# Patient Record
Sex: Female | Born: 1935 | Race: White | Hispanic: No | Smoking: Never smoker
Health system: Southern US, Community
[De-identification: ages and names within clinical notes are randomized; demographics above are authoritative.]

## PROBLEM LIST (undated history)

## (undated) DIAGNOSIS — K219 Gastro-esophageal reflux disease without esophagitis: Secondary | ICD-10-CM

## (undated) DIAGNOSIS — C50919 Malignant neoplasm of unspecified site of unspecified female breast: Secondary | ICD-10-CM

## (undated) DIAGNOSIS — N3010 Interstitial cystitis (chronic) without hematuria: Secondary | ICD-10-CM

## (undated) DIAGNOSIS — Z9882 Breast implant status: Secondary | ICD-10-CM

## (undated) DIAGNOSIS — M542 Cervicalgia: Secondary | ICD-10-CM

## (undated) DIAGNOSIS — Z87442 Personal history of urinary calculi: Secondary | ICD-10-CM

## (undated) DIAGNOSIS — N3020 Other chronic cystitis without hematuria: Secondary | ICD-10-CM

## (undated) DIAGNOSIS — I10 Essential (primary) hypertension: Secondary | ICD-10-CM

## (undated) DIAGNOSIS — R1310 Dysphagia, unspecified: Secondary | ICD-10-CM

## (undated) DIAGNOSIS — G8929 Other chronic pain: Secondary | ICD-10-CM

## (undated) DIAGNOSIS — A809 Acute poliomyelitis, unspecified: Secondary | ICD-10-CM

## (undated) DIAGNOSIS — E039 Hypothyroidism, unspecified: Secondary | ICD-10-CM

## (undated) DIAGNOSIS — K589 Irritable bowel syndrome without diarrhea: Secondary | ICD-10-CM

## (undated) DIAGNOSIS — F0390 Unspecified dementia without behavioral disturbance: Secondary | ICD-10-CM

## (undated) DIAGNOSIS — R3129 Other microscopic hematuria: Secondary | ICD-10-CM

## (undated) DIAGNOSIS — M549 Dorsalgia, unspecified: Secondary | ICD-10-CM

## (undated) DIAGNOSIS — R32 Unspecified urinary incontinence: Secondary | ICD-10-CM

## (undated) HISTORY — DX: Breast implant status: Z98.82

## (undated) HISTORY — DX: Irritable bowel syndrome without diarrhea: K58.9

## (undated) HISTORY — PX: KNEE SURGERY: SHX244

## (undated) HISTORY — DX: Dorsalgia, unspecified: M54.9

## (undated) HISTORY — PX: BREAST ENHANCEMENT SURGERY: SHX7

## (undated) HISTORY — DX: Cervicalgia: M54.2

## (undated) HISTORY — DX: Other microscopic hematuria: R31.29

## (undated) HISTORY — DX: Other chronic pain: G89.29

## (undated) HISTORY — DX: Personal history of urinary calculi: Z87.442

## (undated) HISTORY — DX: Unspecified urinary incontinence: R32

## (undated) HISTORY — DX: Essential (primary) hypertension: I10

## (undated) HISTORY — DX: Gastro-esophageal reflux disease without esophagitis: K21.9

## (undated) HISTORY — DX: Hypothyroidism, unspecified: E03.9

## (undated) HISTORY — DX: Other chronic cystitis without hematuria: N30.20

## (undated) HISTORY — DX: Acute poliomyelitis, unspecified: A80.9

## (undated) HISTORY — DX: Malignant neoplasm of unspecified site of unspecified female breast: C50.919

## (undated) HISTORY — DX: Interstitial cystitis (chronic) without hematuria: N30.10

## (undated) HISTORY — DX: Dysphagia, unspecified: R13.10

---

## 1978-02-12 HISTORY — PX: ABDOMINAL HYSTERECTOMY: SHX81

## 1991-07-01 HISTORY — PX: OTHER SURGICAL HISTORY: SHX169

## 1994-02-12 HISTORY — PX: CHOLECYSTECTOMY: SHX55

## 2003-08-18 ENCOUNTER — Other Ambulatory Visit: Payer: Self-pay

## 2005-11-14 ENCOUNTER — Ambulatory Visit: Payer: Self-pay | Admitting: Gastroenterology

## 2006-02-19 ENCOUNTER — Ambulatory Visit: Payer: Self-pay | Admitting: Unknown Physician Specialty

## 2006-04-02 ENCOUNTER — Other Ambulatory Visit: Payer: Self-pay

## 2006-04-09 ENCOUNTER — Inpatient Hospital Stay: Payer: Self-pay | Admitting: Unknown Physician Specialty

## 2006-07-21 ENCOUNTER — Ambulatory Visit: Payer: Self-pay | Admitting: Emergency Medicine

## 2007-05-21 ENCOUNTER — Ambulatory Visit: Payer: Self-pay | Admitting: Unknown Physician Specialty

## 2007-12-26 ENCOUNTER — Emergency Department: Payer: Self-pay | Admitting: Emergency Medicine

## 2007-12-27 ENCOUNTER — Inpatient Hospital Stay (HOSPITAL_COMMUNITY): Admission: EM | Admit: 2007-12-27 | Discharge: 2007-12-30 | Payer: Self-pay | Admitting: General Surgery

## 2008-06-17 ENCOUNTER — Ambulatory Visit: Payer: Self-pay | Admitting: Family Medicine

## 2009-06-14 ENCOUNTER — Ambulatory Visit: Payer: Self-pay | Admitting: Family Medicine

## 2009-06-24 ENCOUNTER — Ambulatory Visit: Payer: Self-pay | Admitting: Family Medicine

## 2009-06-27 ENCOUNTER — Ambulatory Visit: Payer: Self-pay | Admitting: Family Medicine

## 2009-09-25 HISTORY — PX: CYSTOSCOPY: SUR368

## 2010-05-18 ENCOUNTER — Ambulatory Visit: Payer: Self-pay | Admitting: Gastroenterology

## 2010-05-22 ENCOUNTER — Encounter: Payer: Self-pay | Admitting: Gastroenterology

## 2010-05-22 LAB — PATHOLOGY REPORT

## 2010-06-13 ENCOUNTER — Encounter: Payer: Self-pay | Admitting: Gastroenterology
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
## 2010-08-02 ENCOUNTER — Ambulatory Visit: Payer: Self-pay | Admitting: Family Medicine

## 2010-11-15 LAB — DIFFERENTIAL
Basophils Absolute: 0
Eosinophils Absolute: 0
Eosinophils Relative: 0
Lymphs Abs: 1.3
Monocytes Absolute: 0.7
Neutrophils Relative %: 73

## 2010-11-15 LAB — BASIC METABOLIC PANEL
BUN: 12
Creatinine, Ser: 0.71
GFR calc Af Amer: >60
Potassium: 4.3
Sodium: 140

## 2010-11-15 LAB — CBC
HCT: 36.4
Hemoglobin: 12.4
MCHC: 34
MCV: 94.4

## 2011-04-18 DIAGNOSIS — G14 Postpolio syndrome: Secondary | ICD-10-CM | POA: Insufficient documentation

## 2011-04-18 DIAGNOSIS — G4700 Insomnia, unspecified: Secondary | ICD-10-CM | POA: Insufficient documentation

## 2011-11-22 ENCOUNTER — Ambulatory Visit: Payer: Self-pay | Admitting: Family Medicine

## 2011-11-22 LAB — CREATININE, SERUM
EGFR (African American): >60
EGFR (Non-African Amer.): >60

## 2011-12-12 ENCOUNTER — Ambulatory Visit: Payer: Self-pay | Admitting: Family Medicine

## 2012-01-15 ENCOUNTER — Ambulatory Visit: Payer: Self-pay | Admitting: Gastroenterology

## 2012-01-25 ENCOUNTER — Other Ambulatory Visit: Payer: Self-pay | Admitting: Gastroenterology

## 2012-01-25 ENCOUNTER — Ambulatory Visit: Payer: Self-pay | Admitting: Gastroenterology

## 2012-01-28 LAB — PATHOLOGY REPORT

## 2012-07-21 DIAGNOSIS — E7800 Pure hypercholesterolemia, unspecified: Secondary | ICD-10-CM | POA: Insufficient documentation

## 2012-08-12 DIAGNOSIS — R609 Edema, unspecified: Secondary | ICD-10-CM | POA: Insufficient documentation

## 2012-12-16 ENCOUNTER — Ambulatory Visit: Payer: Self-pay | Admitting: Family Medicine

## 2013-06-18 DIAGNOSIS — Z8673 Personal history of transient ischemic attack (TIA), and cerebral infarction without residual deficits: Secondary | ICD-10-CM | POA: Insufficient documentation

## 2013-06-18 DIAGNOSIS — I6529 Occlusion and stenosis of unspecified carotid artery: Secondary | ICD-10-CM | POA: Insufficient documentation

## 2013-07-21 ENCOUNTER — Emergency Department: Payer: Self-pay | Admitting: Emergency Medicine

## 2013-07-21 DIAGNOSIS — K219 Gastro-esophageal reflux disease without esophagitis: Secondary | ICD-10-CM | POA: Insufficient documentation

## 2013-07-21 LAB — CBC WITH DIFFERENTIAL/PLATELET
Basophil #: 0.1 10*3/uL (ref 0.0–0.1)
Basophil %: 0.9 %
Eosinophil #: 0.2 10*3/uL (ref 0.0–0.7)
Eosinophil %: 2.5 %
HCT: 41 % (ref 35.0–47.0)
HGB: 13.5 g/dL (ref 12.0–16.0)
LYMPHS PCT: 24.7 %
Lymphocyte #: 1.7 10*3/uL (ref 1.0–3.6)
MCH: 31.1 pg (ref 26.0–34.0)
MCHC: 33 g/dL (ref 32.0–36.0)
MCV: 94 fL (ref 80–100)
MONOS PCT: 9.1 %
Monocyte #: 0.6 x10 3/mm (ref 0.2–0.9)
Neutrophil #: 4.4 10*3/uL (ref 1.4–6.5)
Neutrophil %: 62.8 %
PLATELETS: 220 10*3/uL (ref 150–440)
RBC: 4.35 10*6/uL (ref 3.80–5.20)
RDW: 12.6 % (ref 11.5–14.5)
WBC: 7 10*3/uL (ref 3.6–11.0)

## 2013-07-21 LAB — URINALYSIS, COMPLETE
BLOOD: NEGATIVE
Bacteria: NONE SEEN
Bilirubin,UR: NEGATIVE
GLUCOSE, UR: NEGATIVE mg/dL (ref 0–75)
Ketone: NEGATIVE
LEUKOCYTE ESTERASE: NEGATIVE
Nitrite: NEGATIVE
Ph: 7 (ref 4.5–8.0)
Protein: NEGATIVE
RBC,UR: 1 /HPF (ref 0–5)
SPECIFIC GRAVITY: 1.011 (ref 1.003–1.030)
Squamous Epithelial: <1

## 2013-07-21 LAB — COMPREHENSIVE METABOLIC PANEL
ALBUMIN: 3.7 g/dL (ref 3.4–5.0)
ALT: 12 U/L (ref 12–78)
ANION GAP: 6 — AB (ref 7–16)
Alkaline Phosphatase: 95 U/L
BUN: 14 mg/dL (ref 7–18)
Bilirubin,Total: 0.7 mg/dL (ref 0.2–1.0)
CALCIUM: 9 mg/dL (ref 8.5–10.1)
CHLORIDE: 103 mmol/L (ref 98–107)
CO2: 27 mmol/L (ref 21–32)
CREATININE: 1.09 mg/dL (ref 0.60–1.30)
EGFR (African American): 56 — ABNORMAL LOW
EGFR (Non-African Amer.): 48 — ABNORMAL LOW
GLUCOSE: 94 mg/dL (ref 65–99)
OSMOLALITY: 272 (ref 275–301)
Potassium: 3.5 mmol/L (ref 3.5–5.1)
SGOT(AST): 23 U/L (ref 15–37)
Sodium: 136 mmol/L (ref 136–145)
TOTAL PROTEIN: 7.1 g/dL (ref 6.4–8.2)

## 2013-07-21 LAB — LIPASE, BLOOD: LIPASE: 131 U/L (ref 73–393)

## 2013-07-21 LAB — TROPONIN I: Troponin-I: <0.02 ng/mL

## 2013-07-31 DIAGNOSIS — I480 Paroxysmal atrial fibrillation: Secondary | ICD-10-CM | POA: Insufficient documentation

## 2013-07-31 DIAGNOSIS — I4891 Unspecified atrial fibrillation: Secondary | ICD-10-CM | POA: Insufficient documentation

## 2014-03-23 ENCOUNTER — Ambulatory Visit: Payer: Self-pay | Admitting: Urology

## 2014-05-05 ENCOUNTER — Ambulatory Visit: Payer: Self-pay | Admitting: Gastroenterology

## 2014-05-06 LAB — CREATININE, SERUM
Creatinine: 0.68 mg/dL
EGFR (African American): >60
EGFR (Non-African Amer.): >60

## 2014-07-23 DIAGNOSIS — Z789 Other specified health status: Secondary | ICD-10-CM | POA: Insufficient documentation

## 2014-07-24 DIAGNOSIS — R739 Hyperglycemia, unspecified: Secondary | ICD-10-CM | POA: Insufficient documentation

## 2014-08-09 ENCOUNTER — Encounter: Payer: Self-pay | Admitting: *Deleted

## 2014-08-17 ENCOUNTER — Ambulatory Visit
Admission: RE | Admit: 2014-08-17 | Discharge: 2014-08-17 | Disposition: A | Discharge status: Home / Self Care | Payer: Medicare HMO | Source: Ambulatory Visit | Attending: Urology | Admitting: Urology

## 2014-08-17 ENCOUNTER — Ambulatory Visit (INDEPENDENT_AMBULATORY_CARE_PROVIDER_SITE_OTHER): Payer: Self-pay | Admitting: Urology

## 2014-08-17 ENCOUNTER — Encounter: Payer: Self-pay | Admitting: Urology

## 2014-08-17 ENCOUNTER — Telehealth: Payer: Self-pay

## 2014-08-17 VITALS — BP 172/84 | HR 71 | Resp 18 | Ht 62.0 in

## 2014-08-17 DIAGNOSIS — E039 Hypothyroidism, unspecified: Secondary | ICD-10-CM | POA: Insufficient documentation

## 2014-08-17 DIAGNOSIS — I10 Essential (primary) hypertension: Secondary | ICD-10-CM | POA: Insufficient documentation

## 2014-08-17 DIAGNOSIS — R1030 Lower abdominal pain, unspecified: Secondary | ICD-10-CM | POA: Diagnosis present

## 2014-08-17 DIAGNOSIS — Z87442 Personal history of urinary calculi: Secondary | ICD-10-CM | POA: Insufficient documentation

## 2014-08-17 DIAGNOSIS — R102 Pelvic and perineal pain: Secondary | ICD-10-CM | POA: Insufficient documentation

## 2014-08-17 LAB — URINALYSIS, COMPLETE
BILIRUBIN UA: NEGATIVE
Glucose, UA: NEGATIVE
KETONES UA: NEGATIVE
NITRITE UA: NEGATIVE
PH UA: 6 (ref 5.0–7.5)
Protein, UA: NEGATIVE
RBC UA: NEGATIVE
Specific Gravity, UA: 1.025 (ref 1.005–1.030)
Urobilinogen, Ur: 0.2 mg/dL (ref 0.2–1.0)

## 2014-08-17 LAB — MICROSCOPIC EXAMINATION: Bacteria, UA: NONE SEEN

## 2014-08-17 NOTE — Progress Notes (Signed)
08/17/2014 11:45 AM   Loretta Johns 04-24-1931 [id number]  Referring provider: No referring provider defined for this encounter.  Chief Complaint  Patient presents with  . Flank Pain    Bilateral    HPI: [name] 81 year old white female with a history of kidney stones and interstitial cystitis who presents today complaining of suprapubic pain. This pain has been occurring for the last several days. Her previous stone presented in this manner and she is worried that she may have  another stone.  She denies any gross hematuria, flank pain, nausea, vomiting, fevers or chills.  Her previous kidney stone was treated with ESWL at [facility] back in December. We obtained a renal ultrasound in February which was negative for hydronephrosis. She had a CT scan of the abdomen and pelvis with contrast ordered by her gastroenterologist in March which did not identify any hydronephrosis or stones.  Her UA today is remarkable to 6-10 WBCs per power field. She is not experiencing any dysuria or vaginal burning. She does state that her perineal area becomes irritated when the urine hits the skin.   PMH: Past Medical History  Diagnosis Date  . Polio   . Breast cancer   . Hypothyroidism   . HBP (high blood pressure)   . Chronic cystitis   . IBS (irritable bowel syndrome)   . Microscopic hematuria   . History of augmentation mammoplasty   . Dysphagia   . Cervicodynia   . Esophageal reflux   . History of renal stone   . Incontinence   . Chronic back pain   . Interstitial cystitis     Surgical History: Past Surgical History  Procedure Laterality Date  . Cystoscopy  09/25/2009    with hydrodilation and bladder biopsy and fulgeration of trigone  . Cholecystectomy  1996  . Urethral polyps  07/01/1991  . Abdominal hysterectomy  1980  . Breast enhancement surgery    . Knee surgery Left     Home Medications:    Medication List       This list is accurate as of:  08/17/14 11:45 AM.  Always use your most recent med list.               dicyclomine 10 MG capsule  Commonly known as:  BENTYL  Take 10 mg by mouth 4 (four) times daily -  before meals and at bedtime.     diltiazem 120 MG 24 hr capsule  Commonly known as:  CARDIZEM CD  Take 1 capsule by mouth daily.     hydrochlorothiazide 12.5 MG capsule  Commonly known as:  MICROZIDE  Take 12.5 mg by mouth daily.     HYDROcodone-acetaminophen 10-325 MG per tablet  Commonly known as:  NORCO  Take 1 tablet by mouth every 6 (six) hours as needed.     HYOSCYAMINE SULFATE PO  Take by mouth.     MYRBETRIQ 25 MG Tb24 tablet  Generic drug:  mirabegron ER  Take 25 mg by mouth daily.     ranitidine 150 MG capsule  Commonly known as:  ZANTAC  Take 150 mg by mouth.     solifenacin 5 MG tablet  Commonly known as:  VESICARE  Take 5 mg by mouth daily.     SYNTHROID 137 MCG tablet  Generic drug:  levothyroxine  TAKE 137 MCG BY MOUTH ONCE DAILY.     triamcinolone cream 0.5 %  Commonly known as:  KENALOG  Apply topically.  Allergies:  Allergies  Allergen Reactions  . [place] Grass Allergy Skin Test   . Chocolate Flavor   . Midazolam   . Pravastatin Other (See Comments)    WAS UNABLE TO WALK PER PT.  Marland Kitchen Propoxyphene Napsylate [Propoxyphene]     Darvocet-N 100  . Reglan [Metoclopramide]     Family History: Family History  Problem Relation Age of Onset  . Kidney disease Brother     Reanl cancer  . Prostate cancer Neg Hx     Social History:  reports that she has never smoked. She does not have any smokeless tobacco history on file. She reports that she does not drink alcohol. Her drug history is not on file.  ROS: Urological Symptom Review  Patient is experiencing the following symptoms: Frequent urination Get up at night to urinate   Review of Systems  Gastrointestinal (upper)  : Negative for upper GI symptoms  Gastrointestinal (lower)  : Constipation  Constitutional : Negative for symptoms  Skin: Negative for skin symptoms  Eyes: Negative for eye symptoms  Ear/Nose/Throat : Negative for Ear/Nose/Throat symptoms  Hematologic/Lymphatic: Negative for Hematologic/Lymphatic symptoms  Cardiovascular : Negative for cardiovascular symptoms  Respiratory : Cough  Endocrine: Negative for endocrine symptoms  Musculoskeletal: Back pain Joint pain  Neurological: Negative for neurological symptoms  Psychologic: Negative for psychiatric symptoms   Physical Exam: BP 172/84 mmHg  Pulse 71  Resp 18  Ht _0  (1.575 m)   Laboratory Data: Results for orders placed or performed in visit on 05/05/14  Creatinine, serum  Result Value Ref Range   Creatinine 0.68 mg/dL   EGFR (African American) >60    EGFR (Non-African Amer.) >60    Lab Results  Component Value Date   WBC 7.0 07/21/2013   HGB 13.5 07/21/2013   HCT 41.0 07/21/2013   MCV 94 07/21/2013   PLT 220 07/21/2013    Lab Results  Component Value Date   CREATININE 0.68 05/06/2014    No results found for: PSA  No results found for: TESTOSTERONE  No results found for: HGBA1C  Urinalysis No results found for: COLORURINE, APPEARANCEUR, LABSPEC, PHURINE, GLUCOSEU, HGBUR, BILIRUBINUR, KETONESUR, PROTEINUR, UROBILINOGEN, NITRITE, LEUKOCYTESUR  Pertinent Imaging:   Assessment & Plan:    1. Suprapubic pain:   Patient with a several day history of suprapubic pain. UA is significant for 6-10 WBCs per high-power field. She is worried about another kidney stone since her last stone presented in this way back in December 2015. We will obtain a KUB today for further evaluation.  Urine will be sent for culture. She'll be contacted with those results.  2. History of stone:   Patient is having symptoms reminiscent of previous stone. We will obtain a KUB.  She'll be contacted with the results of KUB and given further instructions.     - Urinalysis,  Complete   No Follow-up on file.  Zara Council, [facility] [address], [place], [place] [address] [phone]

## 2014-08-17 NOTE — Telephone Encounter (Signed)
-----   Message from Nori Riis, PA-C sent at 08/17/2014  1:37 PM EDT ----- KUB does not demonstrate any stones.

## 2014-08-17 NOTE — Telephone Encounter (Signed)
Spoke with pt daughter and gave KUB results. Cw,lpn

## 2014-08-19 LAB — CULTURE, URINE COMPREHENSIVE

## 2014-08-20 ENCOUNTER — Telehealth: Payer: Self-pay

## 2014-08-20 NOTE — Telephone Encounter (Signed)
-----   Message from Nori Riis, PA-C sent at 08/19/2014  5:15 PM EDT ----- Urine culture is negative for infection.  Organism is most likely a skin contaminate.

## 2014-08-20 NOTE — Telephone Encounter (Signed)
Spoke with pt in reference to negative urine cx. Pt voiced understanding but c/o urinary urgency. Please advise. Cw,lpn

## 2014-08-20 NOTE — Telephone Encounter (Signed)
Is she still taking her Myrbetriq?  If so, she can increase it to 50 mg.  You can give her samples.

## 2014-08-23 NOTE — Telephone Encounter (Signed)
Spoke with pt who stated she is currently taking 25mg . Made her aware of needing to try 50mg . Pt voiced understanding. Samples left at the front. Cw,lpn

## 2014-10-05 ENCOUNTER — Telehealth: Payer: Self-pay

## 2014-10-05 NOTE — Telephone Encounter (Signed)
Myrbetriq is a beta-blocker for the bladder and sulfamethoxazole is an antibiotic.  It is no longer recommended to be on antibiotics long term by ID because of the increase of bacterial resistance.  What symptoms is she having?

## 2014-10-05 NOTE — Telephone Encounter (Signed)
Pt called stating myrbetriq is not working for her and she would like to go back on sulfamethoxazole. Per pt Dr. Ernst Spell perscripted sulfamethoxazole and it worked well. Please advise.

## 2014-10-14 ENCOUNTER — Ambulatory Visit: Payer: Medicare HMO

## 2014-10-14 DIAGNOSIS — N390 Urinary tract infection, site not specified: Secondary | ICD-10-CM

## 2014-10-14 LAB — URINALYSIS, COMPLETE
BILIRUBIN UA: NEGATIVE
Glucose, UA: NEGATIVE
KETONES UA: NEGATIVE
NITRITE UA: NEGATIVE
Protein, UA: NEGATIVE
RBC UA: NEGATIVE
Specific Gravity, UA: 1.015 (ref 1.005–1.030)
Urobilinogen, Ur: 0.2 mg/dL (ref 0.2–1.0)
pH, UA: 7 (ref 5.0–7.5)

## 2014-10-14 LAB — MICROSCOPIC EXAMINATION: Bacteria, UA: NONE SEEN

## 2014-10-14 NOTE — Progress Notes (Signed)
Pt came in today to give a clean catch urine. Pt called c/o dysuria and mal odorous urine. Urine was sent for u/a and cx.

## 2014-10-14 NOTE — Telephone Encounter (Signed)
Spoke with pt who stated she is having some burning and foul smelling odor. Pt wanted to give a sample therefore she will come in this afternoon.

## 2014-10-16 LAB — CULTURE, URINE COMPREHENSIVE

## 2014-10-20 ENCOUNTER — Telehealth: Payer: Self-pay | Admitting: *Deleted

## 2014-10-20 NOTE — Telephone Encounter (Signed)
Patient called back and left message on voicemail. Called patient back and spoke with her about her results. Culture is negative but patient insists something is going on still in pain in her kidneys and bladder. I let her know I would let Larene Beach know and get back to her. Patient asked if we were going to call her in anything and I explained to the patient we usually do not treat with antibiotic when culture comes back negative. Patient ok with plan. Please advise.

## 2014-10-20 NOTE — Telephone Encounter (Signed)
Patient will need a renal stone protocol CT.

## 2014-10-20 NOTE — Telephone Encounter (Signed)
-----   Message from Nori Riis, PA-C sent at 10/17/2014 11:47 PM EDT ----- Please notify the patient that her urine culture is negative.

## 2014-10-20 NOTE — Telephone Encounter (Signed)
LMOM for patient to return call about labs.

## 2014-10-22 NOTE — Telephone Encounter (Signed)
Called and spoke with patient about renal stone protocol CT. Patient states she had a CT done when they discovered her stones and had them blasted and that is when her problems started. Patient states you used to give her samples of myrbetriq and didn't know why you didn't want her to have it anymore. I stated I would ask but it may be as simple as her insurance would not cover it and we can't keep giving samples out indefinitely if insurance will never cover it. Patient does not seem to want a CT at this time. Please advise.

## 2014-10-24 NOTE — Telephone Encounter (Signed)
She can have Myrbetriq samples.  The reps stated that they would provide samples to those who's insurance would not cover the medication.

## 2014-10-25 NOTE — Telephone Encounter (Signed)
Called patient to let her know Myrbetriq samples are up front for pick up. LMOM.

## 2014-10-29 ENCOUNTER — Other Ambulatory Visit: Payer: Self-pay

## 2014-10-29 DIAGNOSIS — N200 Calculus of kidney: Secondary | ICD-10-CM

## 2014-10-29 NOTE — Progress Notes (Signed)
Pt called asking why she had not heard from imaging center in reference to CT scan. Nurse made pt aware she previously denied a CT. Pt stated "well I need it now! Im hurting and I cant get any relief." Nurse made pt aware that a CT would be the best route to go at this time per [facility]. Pt voiced understanding requesting a CT to be ordered. CT orders are placed.

## 2015-05-31 ENCOUNTER — Emergency Department
Admission: EM | Admit: 2015-05-31 | Discharge: 2015-05-31 | Disposition: A | Discharge status: Home / Self Care | Payer: Medicare PPO | Attending: Emergency Medicine | Admitting: Emergency Medicine

## 2015-05-31 ENCOUNTER — Emergency Department: Payer: Medicare PPO

## 2015-05-31 ENCOUNTER — Encounter: Payer: Self-pay | Admitting: Emergency Medicine

## 2015-05-31 DIAGNOSIS — R51 Headache: Secondary | ICD-10-CM | POA: Diagnosis not present

## 2015-05-31 DIAGNOSIS — Z853 Personal history of malignant neoplasm of breast: Secondary | ICD-10-CM | POA: Insufficient documentation

## 2015-05-31 DIAGNOSIS — E039 Hypothyroidism, unspecified: Secondary | ICD-10-CM | POA: Insufficient documentation

## 2015-05-31 DIAGNOSIS — I10 Essential (primary) hypertension: Secondary | ICD-10-CM | POA: Insufficient documentation

## 2015-05-31 DIAGNOSIS — R519 Headache, unspecified: Secondary | ICD-10-CM

## 2015-05-31 NOTE — Discharge Instructions (Signed)
General Headache Without Cause A headache is pain or discomfort felt around the head or neck area. The specific cause of a headache may not be found. There are many causes and types of headaches. A few common ones are:  Tension headaches.  Migraine headaches.  Cluster headaches.  Chronic daily headaches. HOME CARE INSTRUCTIONS  Watch your condition for any changes. Take these steps to help with your condition: Managing Pain  Take over-the-counter and prescription medicines only as told by your health care provider.  Lie down in a dark, quiet room when you have a headache.  If directed, apply ice to the head and neck area:  Put ice in a plastic bag.  Place a towel between your skin and the bag.  Leave the ice on for 20 minutes, 2-3 times per day.  Use a heating pad or hot shower to apply heat to the head and neck area as told by your health care provider.  Keep lights dim if bright lights bother you or make your headaches worse. Eating and Drinking  Eat meals on a regular schedule.  Limit alcohol use.  Decrease the amount of caffeine you drink, or stop drinking caffeine. General Instructions  Keep all follow-up visits as told by your health care provider. This is important.  Keep a headache journal to help find out what may trigger your headaches. For example, write down:  What you eat and drink.  How much sleep you get.  Any change to your diet or medicines.  Try massage or other relaxation techniques.  Limit stress.  Sit up straight, and do not tense your muscles.  Do not use tobacco products, including cigarettes, chewing tobacco, or e-cigarettes. If you need help quitting, ask your health care provider.  Exercise regularly as told by your health care provider.  Sleep on a regular schedule. Get 7-9 hours of sleep, or the amount recommended by your health care provider. SEEK MEDICAL CARE IF:   Your symptoms are not helped by medicine.  You have a  headache that is different from the usual headache.  You have nausea or you vomit.  You have a fever. SEEK IMMEDIATE MEDICAL CARE IF:   Your headache becomes severe.  You have repeated vomiting.  You have a stiff neck.  You have a loss of vision.  You have problems with speech.  You have pain in the eye or ear.  You have muscular weakness or loss of muscle control.  You lose your balance or have trouble walking.  You feel faint or pass out.  You have confusion.   This information is not intended to replace advice given to you by your health care provider. Make sure you discuss any questions you have with your health care provider.   Document Released: 01/29/2005 Document Revised: 10/20/2014 Document Reviewed: 05/24/2014 Elsevier Interactive Patient Education [employer].  Please return immediately if condition worsens. Please contact her primary physician or the physician you were given for referral. If you have any specialist physicians involved in her treatment and plan please also contact them. Thank you for using [place] [facility] emergency Department. Headache may be either muscle tension in nature possibly posttraumatic concussion return especially if he develops any weakness, blurred vision or loss of vision or any other new concerns.
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
## 2015-05-31 NOTE — ED Notes (Signed)
Pt informed to return if any life threatening symptoms occur.  

## 2015-05-31 NOTE — ED Notes (Signed)
Pt to ed with c/o right side of head pain and blurred vision. Pt states she fell about 2 weeks ago and initially did not have any pain but over the last few days started with pain on right side of head.  Denies use of blood thinners.

## 2015-06-30 ENCOUNTER — Encounter: Payer: Self-pay | Admitting: Emergency Medicine

## 2015-06-30 ENCOUNTER — Ambulatory Visit
Admit: 2015-06-30 | Discharge: 2015-06-30 | Disposition: A | Discharge status: Home / Self Care | Payer: Medicare PPO | Attending: Emergency Medicine | Admitting: Emergency Medicine

## 2015-06-30 ENCOUNTER — Ambulatory Visit
Admission: EM | Admit: 2015-06-30 | Discharge: 2015-06-30 | Disposition: A | Discharge status: Home / Self Care | Payer: Medicare PPO | Attending: Emergency Medicine | Admitting: Emergency Medicine

## 2015-06-30 DIAGNOSIS — I82811 Embolism and thrombosis of superficial veins of right lower extremities: Secondary | ICD-10-CM

## 2015-06-30 DIAGNOSIS — M79661 Pain in right lower leg: Secondary | ICD-10-CM | POA: Insufficient documentation

## 2015-06-30 DIAGNOSIS — I8001 Phlebitis and thrombophlebitis of superficial vessels of right lower extremity: Secondary | ICD-10-CM | POA: Diagnosis not present

## 2015-06-30 NOTE — ED Notes (Signed)
Pt will go to [facility] for [place] of right lower leg to rule out DVT.

## 2015-06-30 NOTE — ED Provider Notes (Signed)
CSN: [id number]     Arrival date & time 06/30/15  1420 History   First MD Initiated Contact with Patient 06/30/15 1524     Chief Complaint  Patient presents with  . Leg Pain   (Consider location/radiation/quality/duration/timing/severity/associated sxs/prior Treatment) HPI  This is a 82-year-old female who presents with right lower calf swelling or warmth and pain. She states that she awoke with this this morning. She is also noticed some tenderness and redness over the posterior thigh as well. She had polio as a child and has a chronically swollen right leg. This leg is unaffected. She has not taken any long trips but does have a tendency of sitting for prolonged periods.    Past Medical History  Diagnosis Date  . Polio   . Breast cancer ([place])   . Hypothyroidism   . HBP (high blood pressure)   . Chronic cystitis   . IBS (irritable bowel syndrome)   . Microscopic hematuria   . History of augmentation mammoplasty   . Dysphagia   . Cervicodynia   . Esophageal reflux   . History of renal stone   . Incontinence   . Chronic back pain   . Interstitial cystitis    Past Surgical History  Procedure Laterality Date  . Cystoscopy  09/25/2009    with hydrodilation and bladder biopsy and fulgeration of trigone  . Cholecystectomy  1996  . Urethral polyps  07/01/1991  . Abdominal hysterectomy  1980  . Breast enhancement surgery    . Knee surgery Left    Family History  Problem Relation Age of Onset  . Kidney disease Brother     Reanl cancer  . Prostate cancer Neg Hx   . CAD Mother   . Cancer Father    Social History  Substance Use Topics  . Smoking status: Never Smoker   . Smokeless tobacco: None  . Alcohol Use: No   OB History    No data available     Review of Systems  Constitutional: Positive for activity change. Negative for fever, chills and fatigue.  Musculoskeletal: Positive for myalgias and gait problem.  Skin: Positive for color change.  All other systems  reviewed and are negative.   Allergies  [place] grass allergy skin test; Chocolate flavor; Midazolam; Pravastatin; Propoxyphene napsylate; and Reglan  Home Medications   Prior to Admission medications   Medication Sig Start Date End Date Taking? Authorizing Provider  dicyclomine (BENTYL) 10 MG capsule Take 10 mg by mouth 4 (four) times daily -  before meals and at bedtime.    Historical Provider, MD  diltiazem (CARDIZEM CD) 120 MG 24 hr capsule Take 1 capsule by mouth daily. 07/23/14 07/23/15  Historical Provider, MD  hydrochlorothiazide (MICROZIDE) 12.5 MG capsule Take 12.5 mg by mouth daily.    Historical Provider, MD  HYDROcodone-acetaminophen (NORCO) 10-325 MG per tablet Take 1 tablet by mouth every 6 (six) hours as needed.    Historical Provider, MD  HYOSCYAMINE SULFATE PO Take by mouth.    Historical Provider, MD  mirabegron ER (MYRBETRIQ) 25 MG TB24 tablet Take 25 mg by mouth daily.    Historical Provider, MD  ranitidine (ZANTAC) 150 MG capsule Take 150 mg by mouth. 02/02/13   Historical Provider, MD  solifenacin (VESICARE) 5 MG tablet Take 5 mg by mouth daily.    Historical Provider, MD  SYNTHROID 137 MCG tablet TAKE 137 [place]. 07/24/14   Historical Provider, MD   Meds Ordered and Administered this  Visit  Medications - No data to display  BP 145/77 mmHg  Pulse 81  Temp(Src) 97.9 F (36.6 C) (Oral)  Resp 18  SpO2 96% No data found.   Physical Exam  Constitutional: She is oriented to person, place, and time. She appears well-developed and well-nourished. No distress.  HENT:  Head: Normocephalic and atraumatic.  Eyes: Conjunctivae are normal. Pupils are equal, round, and reactive to light.  Neck: Normal range of motion. Neck supple.  Musculoskeletal: Normal range of motion. She exhibits edema and tenderness.  Examination of the right leg shows the mid calf to be very tender warm and mildly swollen. This extends proximal for several inches. She is also tender  on the mid posterolateral medial eye where there is redness and tenderness as well. The right leg is chronically swollen so circumference differential is not reliable. She does have a negative Homans sign. She is using a scooter for mobility  Neurological: She is alert and oriented to person, place, and time.  Skin: Skin is warm and dry. She is not diaphoretic.  Psychiatric: She has a normal mood and affect. Her behavior is normal. Judgment and thought content normal.  Nursing note and vitals reviewed.   ED Course  Procedures (including critical care time)  Labs Review Labs Reviewed - No data to display  Imaging Review US Venous Img Lower Unilateral Right  06/30/2015  CLINICAL DATA:  Right calf pain with tenderness and erythema. EXAM: RIGHT LOWER EXTREMITY VENOUS DOPPLER ULTRASOUND TECHNIQUE: Gray-scale sonography with graded compression, as well as color Doppler and duplex ultrasound were performed to evaluate the lower extremity deep venous systems from the level of the common femoral vein and including the common femoral, femoral, profunda femoral, popliteal and calf veins including the posterior tibial, peroneal and gastrocnemius veins when visible. The superficial great saphenous vein was also interrogated. Spectral Doppler was utilized to evaluate flow at rest and with distal augmentation maneuvers in the common femoral, femoral and popliteal veins. COMPARISON:  None. FINDINGS: Contralateral Common Femoral Vein: Respiratory phasicity is normal and symmetric with the symptomatic side. No evidence of thrombus. Normal compressibility. Common Femoral Vein: No evidence of thrombus. Normal compressibility, respiratory phasicity and response to augmentation. Saphenofemoral Junction: No evidence of thrombus. Normal compressibility and flow on color Doppler imaging. Profunda Femoral Vein: No evidence of thrombus. Normal compressibility and flow on color Doppler imaging. Femoral Vein: No evidence of  thrombus. Normal compressibility, respiratory phasicity and response to augmentation. Popliteal Vein: No evidence of thrombus. Normal compressibility, respiratory phasicity and response to augmentation. Calf Veins: No evidence of thrombus. Normal compressibility and flow on color Doppler imaging. Superficial Great Saphenous Vein: No evidence of thrombus. Normal compressibility and flow on color Doppler imaging. Venous Reflux:  None. Other Findings: There is evidence of superficial thrombophlebitis involving the short saphenous vein. The proximal calf segment contains nonocclusive thrombus. The vein does demonstrate some flow in the proximal calf. The short saphenous vein in the mid to distal calf contains occlusive thrombus. No extension of thrombus is identified into the popliteal vein through the saphenopopliteal junction. IMPRESSION: Evidence of superficial thrombophlebitis involving the right short saphenous vein in the calf. No evidence of deep vein thrombosis. Electronically Signed   By: Aletta Edouard M.D.   On: 06/30/2015 17:30     Visual Acuity Review  Right Eye Distance:   Left Eye Distance:   Bilateral Distance:    Right Eye Near:   Left Eye Near:    Bilateral Near:  MDM   1. Acute superficial venous thrombosis of lower extremity, right    Plan: 1. Test/x-ray results and diagnosis reviewed with patient 2. rx as per orders; risks, benefits, potential side effects reviewed with patient 3. Recommend supportive treatment with Rest elevation and warm compresses. Also start her on a daily aspirin she prefers over nostril anti-inflammatory medications. I recommended that she follow-up with her primary care physician next week. I talked to the patient directly in ultrasound following her D her a venous Doppler ultrasound plane to her the pathology. He does not have a DVT. Her treatment will be superficial and with aspirin only. If she has any further problems or worsening she  should go to the emergency room or ulcer primary care physician. 4. F/u prn if symptoms worsen or don't improve     Lorin Picket, PA-C 06/30/15 1821

## 2015-06-30 NOTE — Discharge Instructions (Signed)
Venous Thromboembolism Venous thromboembolism (VTE) is a condition in which a blood clot (thrombus) develops in the body. A thrombus usually occurs in a deep vein in the leg or the pelvis, but it can also occur in the arm. Sometimes, pieces of a thrombus can break off from its original place of development and travel through the bloodstream to other parts of the body. When that happens, the thrombus is called an embolism. An embolism can block the blood flow in the blood vessels of other organs. There are two serious types of VTE:  Deep vein thrombosis (DVT). A DVT is a thrombus that usually occurs in a deep, larger vein of the lower leg or the pelvis, or in an upper extremity such as the arm.  Pulmonary embolism (PE). A PE occurs when an embolism has formed and traveled to the lungs. A PE can block or decrease the blood flow in one lung or both lungs. VTE is a serious health condition that can cause disability or death. It is very important to get help right away and to not ignore symptoms. CAUSES VTE is caused by the formation of a blood clot in your leg, pelvis, or arm. Usually, several things contribute to the formation of blood clots. A clot may develop when:  Your blood flow slows down.  Your vein becomes damaged in some way.  You have a condition that makes your blood clot more easily. RISK FACTORS A VTE is more likely to develop in:  People who are older, especially over 55 years of age.  People who are overweight (obese).  People who sit or lie still for a long time, such as during long-distance travel (over 4 hours), bed rest, hospitalization, or during recovery from certain medical conditions like a stroke.  People who do not engage in much physical activity (sedentary lifestyle).  People who have chronic breathing disorders.  People who have a personal or family history of blood clots or blood clotting disease.  People who have peripheral vascular disease (PVD), diabetes,  or some types of cancer.  People who have heart disease, especially if the person had a recent heart attack or has congestive heart failure.  People who have neurological diseases that affect the legs (leg paresis).  People who have had a traumatic injury, such as breaking a hip or leg.  People who have recently had major or lengthy surgery, especially on the hip, knee, or abdomen.  People who have had a central line placed inside a large vein.  People who take medicines that contain the hormone estrogen. These include birth control pills and hormone replacement therapy.  Pregnancy or during childbirth or the postpartum period.  Long plane flights (over 8 hours). SIGNS AND SYMPTOMS  Symptoms of VTE can depend on where the clot is located and whether the clot breaks off and travels to another organ. Sometimes, there may be no symptoms. Symptoms of a DVT can include:  Swelling of your leg or arm, especially if one side is much worse.  Warmth and redness of your leg or arm, especially if one side is much worse.  Pain in your arm or leg. If the clot is in your leg, symptoms may be more noticeable or worse when you stand or walk.  A feeling of pins and needles if the clot is in the arm. The symptoms of a PE usually start suddenly and include:  Shortness of breath while active or at rest.  Coughing or coughing up blood or  blood-tinged mucus.  Chest pain that is often worse with deep breaths.  Rapid or irregular heartbeat.  Feeling light-headed or dizzy.  Fainting.  Feeling anxious.  Sweating. There may also be pain and swelling in a leg if that is where the blood clot started. These symptoms may represent a serious problem that is an emergency. Do not wait to see if the symptoms will go away. Get medical help right away. Call your local emergency services (911 in the [place] Do not drive yourself to the hospital. DIAGNOSIS Your health care provider will take a medical history  and perform a physical exam. You may also have other tests, including:  Blood tests to assess the clotting properties of your blood.  Imaging tests, such as CT, ultrasound, MRI, X-ray, and other tests to see if you have clots anywhere in your body.  An electrocardiogram (ECG) to look for heart strain from blood clots in the lungs.  An echocardiogram. TREATMENT After a VTE is identified, it can be treated. The main goals of treatment are:  To stop a blood clot from growing larger.  To stop new blood clots from forming.  To stop a blood clot from traveling to the lungs (pulmonary embolism). The type of treatment that you receive depends on many factors, such as the cause of your VTE, your risk for bleeding or developing more clots, and other medical conditions that you have. Sometimes, a combination of treatments is necessary. Treatment options may be combined and include:  Monitoring the blood clot with ultrasound.  Taking medicines by mouth, such as newer blood thinners (anticoagulants), thrombolytics, or warfarin.  Taking anticoagulant medicine by injection or through an IV tube.  Wearingcompression stockings or using different types of devices.  Surgery (rare) to remove the blood clot or to place a filter in your abdomen to stop the blood clot from traveling to your lungs. Treatments for VTE are often divided into immediate treatment and long-term treatment (up to 3 months after VTE). You can work with your health care provider to choose the treatment program that is best for you. HOME CARE INSTRUCTIONS If you are taking a newer oral anticoagulant:  Take the medicine every single day at the same time each day.  Understand what foods and drugs interact with this medicine.  Understand that there are no regular blood tests required when using this medicine.  Understand the side effects of this medicine, including excessive bruising or bleeding. Ask your health care provider or  pharmacist about other possible side effects. If you are taking warfarin:  Understand how to take warfarin and know which foods can affect how warfarin works in Veterinary surgeon.  Understand that it is dangerous to take too much or too little warfarin. Too much warfarin increases the risk of bleeding. Too little warfarin continues to allow the risk for blood clots.  Follow your PT and INR blood testing schedule. The PT and INR results allow your health care provider to adjust your dose of warfarin. It is very important that you have your PT and INR tested as often as told by your health care provider.  Avoid major changes in your diet, or tell your health care provider before you change your diet. Arrange a visit with a registered dietitian to answer your questions. Many foods, especially foods that are high in vitamin K, can interfere with warfarin and affect the PT and INR results. Eat a consistent amount of foods that are high in vitamin K, such as:  Spinach, kale, broccoli, cabbage, collard greens, turnip greens, Brussels sprouts, peas, cauliflower, seaweed, and parsley.  Beef liver and pork liver.  Green tea.  Soybean oil.  Tell your health care provider about any and all medicines, vitamins, and supplements that you take, including aspirin and other over-the-counter anti-inflammatory medicines. Be especially cautious with aspirin and anti-inflammatory medicines. Do not take those before you ask your health care provider if it is safe to do so. This is important because many medicines can interfere with warfarin and affect the PT and INR results.  Do not start or stop taking any over-the-counter or prescription medicine unless your health care provider or pharmacist tells you to do so. If you take warfarin, you will also need to do these things:  Hold pressure over cuts for longer than usual.  Tell your dentist and other health care providers that you are taking warfarin before you have any  procedures in which bleeding may occur.  Avoid alcohol or drink very small amounts. Tell your health care provider if you change your alcohol intake.  Do not use tobacco products, including cigarettes, chewing tobacco, and e-cigarettes. If you need help quitting, ask your health care provider.  Avoid contact sports. General Instructions  Take over-the-counter and prescription medicines only as told by your health care provider. Anticoagulant medicines can have side effects, including easy bruising and difficulty stopping bleeding. If you are prescribed an anticoagulant, you will also need to do these things:  Hold pressure over cuts for longer than usual.  Tell your dentist and other health care providers that you are taking anticoagulants before you have any procedures in which bleeding may occur.  Avoid contact sports.  Wear a medical alert bracelet or carry a medical alert card that says you have had a PE.  Ask your health care provider how soon you can go back to your normal activities. Stay active to prevent new blood clots from forming.  Make sure to exercise while traveling or when you have been sitting or standing for a long period of time. It is very important to exercise. Exercise your legs by walking or by tightening and relaxing your leg muscles often. Take frequent walks.  Wear compression stockings as told by your health care provider to help prevent more blood clots from forming.  Do not use tobacco products, including cigarettes, chewing tobacco, and e-cigarettes. If you need help quitting, ask your health care provider.  Keep all follow-up appointments with your health care provider. This is important. PREVENTION Take these actions to decrease your risk of developing another VTE:  Exercise regularly. For at least 30 minutes every day, engage in:  Activity that involves moving your arms and legs.  Activity that encourages good blood flow through your body by  increasing your heart rate.  Exercise your arms and legs every hour during long-distance travel (over 4 hours). Drink plenty of water and avoid drinking alcohol while traveling.  Avoid sitting or lying in bed for long periods of time without moving your legs.  Maintain a weight that is appropriate for your height. Ask your health care provider what weight is healthy for you.  If you are a woman who is over 46 years of age, avoid unnecessary use of medicines that contain estrogen. These include birth control pills.  Do not smoke, especially if you take estrogen medicines. If you need help quitting, ask your health care provider. If you are hospitalized, prevention measures may include:  Early walking after surgery,  as soon as your health care provider says that it is safe.  Receiving anticoagulants to prevent blood clots.If you cannot take anticoagulants, other options may be available, such as wearing compression stockings or using different types of devices. SEEK IMMEDIATE MEDICAL CARE IF:  You have new or increased pain, swelling, or redness in an arm or leg.  You have numbness or tingling in an arm or leg.  You have shortness of breath while active or at rest.  You have chest pain.  You have a rapid or irregular heartbeat.  You feel light-headed or dizzy.  You cough up blood.  You notice blood in your vomit, bowel movement, or urine. These symptoms may represent a serious problem that is an emergency. Do not wait to see if the symptoms will go away. Get medical help right away. Call your local emergency services (911 in the [place] Do not drive yourself to the hospital.   This information is not intended to replace advice given to you by your health care provider. Make sure you discuss any questions you have with your health care provider.   Document Released: 11/26/2008 Document Revised: 10/20/2014 Document Reviewed: 05/26/2014 Elsevier Interactive Patient Education [employer]

## 2015-06-30 NOTE — ED Notes (Signed)
Pt presents with right lower leg swelling in calf area, noticed this am. Hurts worse when weight bearing.

## 2016-01-27 ENCOUNTER — Emergency Department: Payer: Medicare PPO

## 2016-01-27 ENCOUNTER — Encounter: Payer: Self-pay | Admitting: Emergency Medicine

## 2016-01-27 ENCOUNTER — Inpatient Hospital Stay
Admission: EM | Admit: 2016-01-27 | Discharge: 2016-01-28 | DRG: 816 | Disposition: A | Discharge status: Home / Self Care | Payer: Medicare PPO | Attending: Internal Medicine | Admitting: Internal Medicine

## 2016-01-27 DIAGNOSIS — M419 Scoliosis, unspecified: Secondary | ICD-10-CM | POA: Diagnosis present

## 2016-01-27 DIAGNOSIS — K219 Gastro-esophageal reflux disease without esophagitis: Secondary | ICD-10-CM | POA: Diagnosis present

## 2016-01-27 DIAGNOSIS — I10 Essential (primary) hypertension: Secondary | ICD-10-CM | POA: Diagnosis present

## 2016-01-27 DIAGNOSIS — R1012 Left upper quadrant pain: Secondary | ICD-10-CM

## 2016-01-27 DIAGNOSIS — Z91018 Allergy to other foods: Secondary | ICD-10-CM

## 2016-01-27 DIAGNOSIS — Z79899 Other long term (current) drug therapy: Secondary | ICD-10-CM

## 2016-01-27 DIAGNOSIS — I700 Atherosclerosis of aorta: Secondary | ICD-10-CM | POA: Diagnosis present

## 2016-01-27 DIAGNOSIS — I480 Paroxysmal atrial fibrillation: Secondary | ICD-10-CM | POA: Diagnosis present

## 2016-01-27 DIAGNOSIS — K589 Irritable bowel syndrome without diarrhea: Secondary | ICD-10-CM | POA: Diagnosis present

## 2016-01-27 DIAGNOSIS — D735 Infarction of spleen: Principal | ICD-10-CM | POA: Diagnosis present

## 2016-01-27 DIAGNOSIS — G8929 Other chronic pain: Secondary | ICD-10-CM | POA: Diagnosis present

## 2016-01-27 DIAGNOSIS — Z853 Personal history of malignant neoplasm of breast: Secondary | ICD-10-CM

## 2016-01-27 DIAGNOSIS — I4891 Unspecified atrial fibrillation: Secondary | ICD-10-CM

## 2016-01-27 DIAGNOSIS — Z888 Allergy status to other drugs, medicaments and biological substances status: Secondary | ICD-10-CM

## 2016-01-27 DIAGNOSIS — Z8249 Family history of ischemic heart disease and other diseases of the circulatory system: Secondary | ICD-10-CM

## 2016-01-27 DIAGNOSIS — E039 Hypothyroidism, unspecified: Secondary | ICD-10-CM | POA: Diagnosis present

## 2016-01-27 DIAGNOSIS — R110 Nausea: Secondary | ICD-10-CM | POA: Diagnosis present

## 2016-01-27 DIAGNOSIS — M549 Dorsalgia, unspecified: Secondary | ICD-10-CM | POA: Diagnosis present

## 2016-01-27 LAB — HEPATIC FUNCTION PANEL
ALBUMIN: 3.9 g/dL (ref 3.5–5.0)
ALT: 15 U/L (ref 14–54)
AST: 30 U/L (ref 15–41)
Alkaline Phosphatase: 75 U/L (ref 38–126)
BILIRUBIN TOTAL: 0.8 mg/dL (ref 0.3–1.2)
Bilirubin, Direct: 0.1 mg/dL (ref 0.1–0.5)
Indirect Bilirubin: 0.7 mg/dL (ref 0.3–0.9)
TOTAL PROTEIN: 7.1 g/dL (ref 6.5–8.1)

## 2016-01-27 LAB — BASIC METABOLIC PANEL
Anion gap: 7 (ref 5–15)
BUN: 14 mg/dL (ref 6–20)
CALCIUM: 9 mg/dL (ref 8.9–10.3)
CO2: 24 mmol/L (ref 22–32)
CREATININE: 0.77 mg/dL (ref 0.44–1.00)
Chloride: 104 mmol/L (ref 101–111)
Glucose, Bld: 113 mg/dL — ABNORMAL HIGH (ref 65–99)
Potassium: 3.5 mmol/L (ref 3.5–5.1)
SODIUM: 135 mmol/L (ref 135–145)

## 2016-01-27 LAB — CBC
HCT: 40.7 % (ref 35.0–47.0)
Hemoglobin: 14.1 g/dL (ref 12.0–16.0)
MCH: 32.1 pg (ref 26.0–34.0)
MCHC: 34.8 g/dL (ref 32.0–36.0)
MCV: 92.3 fL (ref 80.0–100.0)
PLATELETS: 216 10*3/uL (ref 150–440)
RBC: 4.41 MIL/uL (ref 3.80–5.20)
RDW: 12.7 % (ref 11.5–14.5)
WBC: 8.6 10*3/uL (ref 3.6–11.0)

## 2016-01-27 LAB — LIPASE, BLOOD: LIPASE: 24 U/L (ref 11–51)

## 2016-01-27 LAB — TROPONIN I

## 2016-01-27 MED ORDER — MORPHINE SULFATE (PF) 4 MG/ML IV SOLN
2.0000 mg | Freq: Once | INTRAVENOUS | Status: AC
  Administered 2016-01-27: 2 mg via INTRAVENOUS
  Filled 2016-01-27: qty 1

## 2016-01-27 MED ORDER — IOPAMIDOL (ISOVUE-300) INJECTION 61%
30.0000 mL | Freq: Once | INTRAVENOUS | Status: AC | PRN
  Administered 2016-01-27: 30 mL via ORAL

## 2016-01-27 MED ORDER — ONDANSETRON HCL 4 MG/2ML IJ SOLN
4.0000 mg | INTRAMUSCULAR | Status: AC
  Administered 2016-01-27: 4 mg via INTRAVENOUS
  Filled 2016-01-27: qty 2

## 2016-01-27 MED ORDER — IOPAMIDOL (ISOVUE-300) INJECTION 61%
100.0000 mL | Freq: Once | INTRAVENOUS | Status: AC | PRN
  Administered 2016-01-27: 100 mL via INTRAVENOUS

## 2016-01-27 NOTE — ED Notes (Signed)
report off to April rn

## 2016-01-27 NOTE — ED Provider Notes (Signed)
[facility] Emergency Department Provider Note  ____________________________________________   First MD Initiated Contact with Patient 01/27/16 2029     (approximate)  I have reviewed the triage vital signs and the nursing notes.   HISTORY  Chief Complaint Shortness of Breath    HPI Loretta Johns is a 82 y.o. female who is relatively healthy for her age, independent, and with no history of dementia who presents for evaluation of abdominal pain since last night.  She has had several episodes of diarrhea and gradually worsening left upper quadrant abdominal pain since last night.  It is worse with deep breaths, ambulation, and movement, but also hurts at rest.  She reports the pain is at least moderate.  She states that she has not had as much diarrhea today but the pain has gotten steadily worse.  He denies taking any recent antibiotics.  She has had nausea but no vomiting.  The pain is also worse when she takes deep breaths but she is not having any difficulty breathing.  She has pain in the other part of her abdomen as well but is most notable on the left side.  She denies dysuria, fever/chills, chest pain.  She went to [facility] urgent care and was sent here for further evaluation of her abdominal tenderness to palpation.   Past Medical History:  Diagnosis Date  . Breast cancer ([place])   . Cervicodynia   . Chronic back pain   . Chronic cystitis   . Dysphagia   . Esophageal reflux   . HBP (high blood pressure)   . History of augmentation mammoplasty   . History of renal stone   . Hypothyroidism   . IBS (irritable bowel syndrome)   . Incontinence   . Interstitial cystitis   . Microscopic hematuria   . Polio     Patient Active Problem List   Diagnosis Date Noted  . Splenic infarct 01/27/2016  . Acquired hypothyroidism 08/17/2014  . Essential (primary) hypertension 08/17/2014  . Suprapubic pain 08/17/2014  . History of renal stone 08/17/2014    . Blood glucose elevated 07/24/2014  . Drug intolerance 07/23/2014  . Atrial fibrillation with rapid ventricular response ([place]) 07/31/2013  . AF (paroxysmal atrial fibrillation) ([place]) 07/31/2013  . Acid reflux 07/21/2013  . Carotid artery narrowing 06/18/2013  . H/O transient cerebral ischemia 06/18/2013  . Accumulation of fluid in tissues 08/12/2012  . Hypercholesterolemia without hypertriglyceridemia 07/21/2012  . Cannot sleep 04/18/2011  . Post poliomyelitis syndrome 04/18/2011    Past Surgical History:  Procedure Laterality Date  . ABDOMINAL HYSTERECTOMY  1980  . BREAST ENHANCEMENT SURGERY    . CHOLECYSTECTOMY  1996  . CYSTOSCOPY  09/25/2009   with hydrodilation and bladder biopsy and fulgeration of trigone  . KNEE SURGERY Left   . Urethral polyps  07/01/1991    Prior to Admission medications   Medication Sig Start Date End Date Taking? Authorizing Provider  dicyclomine (BENTYL) 10 MG capsule Take 10 mg by mouth 4 (four) times daily -  before meals and at bedtime.    Historical Provider, MD  diltiazem (CARDIZEM CD) 120 MG 24 hr capsule Take 1 capsule by mouth daily. 07/23/14 07/23/15  Historical Provider, MD  diltiazem (CARDIZEM CD) 120 MG 24 hr capsule Take 120 mg by mouth daily. 01/09/16   Historical Provider, MD  donepezil (ARICEPT) 10 MG tablet Take 10 mg by mouth at bedtime. 12/30/15   Historical Provider, MD  hydrochlorothiazide (MICROZIDE) 12.5 MG capsule Take 12.5  mg by mouth daily.    Historical Provider, MD  HYDROcodone-acetaminophen (NORCO) 10-325 MG per tablet Take 1 tablet by mouth every 6 (six) hours as needed.    Historical Provider, MD  HYOSCYAMINE SULFATE PO Take by mouth.    Historical Provider, MD  mirabegron ER (MYRBETRIQ) 25 MG TB24 tablet Take 25 mg by mouth daily.    Historical Provider, MD  ranitidine (ZANTAC) 150 MG capsule Take 150 mg by mouth. 02/02/13   Historical Provider, MD  solifenacin (VESICARE) 5 MG tablet Take 5 mg by mouth daily.     Historical Provider, MD  SYNTHROID 137 MCG tablet TAKE 137 [place]. 07/24/14   Historical Provider, MD    Allergies [place] grass allergy skin test; Chocolate flavor; Midazolam; Pravastatin; Propoxyphene napsylate [propoxyphene]; and Reglan [metoclopramide]  Family History  Problem Relation Age of Onset  . CAD Mother   . Cancer Father   . Kidney disease Brother     Reanl cancer  . Prostate cancer Neg Hx     Social History Social History  Substance Use Topics  . Smoking status: Never Smoker  . Smokeless tobacco: Never Used  . Alcohol use No    Review of Systems Constitutional: No fever/chills Eyes: No visual changes. ENT: No sore throat. Cardiovascular: Denies chest pain. Respiratory: No shortness of breath but pain with deep inspiration Gastrointestinal: +abdominal pain primarily in the left upper quadrant. nausea, no vomiting.  +diarrhea.  No constipation. Genitourinary: Negative for dysuria. Musculoskeletal: Negative for back pain. Skin: Negative for rash. Neurological: Negative for headaches, focal weakness or numbness.  10-point ROS otherwise negative.  ____________________________________________   PHYSICAL EXAM:  VITAL SIGNS: ED Triage Vitals  Enc Vitals Group     BP 01/27/16 1723 (!) 118/48     Pulse Rate 01/27/16 1723 99     Resp 01/27/16 1723 20     Temp 01/27/16 1723 97.7 F (36.5 C)     Temp Source 01/27/16 1723 Oral     SpO2 01/27/16 1723 97 %     Weight 01/27/16 1723 160 lb (72.6 kg)     Height 01/27/16 1723 5\' 2"  (1.575 m)     Head Circumference --      Peak Flow --      Pain Score 01/27/16 1724 5     Pain Loc --      Pain Edu? --      Excl. in [place]? --     Constitutional: Alert and oriented. Well appearing and in no acute distress. She does wince when she moves around in the bed but is willing and able to joke around with me during the interview Eyes: Conjunctivae are normal. PERRL. EOMI. Head: Atraumatic. Nose: No  congestion/rhinnorhea. Mouth/Throat: Mucous membranes are moist.  Oropharynx non-erythematous. Neck: No stridor.  No meningeal signs.   Cardiovascular: Normal rate, regular rhythm. Good peripheral circulation. Grossly normal heart sounds. Respiratory: Normal respiratory effort.  No retractions. Lungs CTAB. Gastrointestinal: Soft with significant tednerness to palpation in primarily in the LUQ w/ guarding.  No lower abd tenderness.  Mild RUQ tenderness. Musculoskeletal: No lower extremity tenderness nor edema. No gross deformities of extremities. Neurologic:  Normal speech and language. No gross focal neurologic deficits are appreciated.  Skin:  Skin is warm, dry and intact. No rash noted. Psychiatric: Mood and affect are normal. Speech and behavior are normal.  ____________________________________________   LABS (all labs ordered are listed, but only abnormal results are displayed)  Labs Reviewed  BASIC METABOLIC PANEL - Abnormal; Notable for the following:       Result Value   Glucose, Bld 113 (*)    All other components within normal limits  CBC  TROPONIN I  HEPATIC FUNCTION PANEL  LIPASE, BLOOD   ____________________________________________  EKG  ED ECG REPORT I, Sylar Voong, the attending physician, personally viewed and interpreted this ECG.  Date: 01/27/2016 EKG Time: 17:41 Rate: about 100 Rhythm: atrial fibrillation with significant artifact on EKG QRS Axis: normal Intervals: normal ST/T Wave abnormalities: Non-specific ST segment / T-wave changes, but no evidence of acute ischemia. Conduction Disturbances: none  ____________________________________________  RADIOLOGY   Dg Chest 2 View  Result Date: 01/27/2016 CLINICAL DATA:  Nausea since last night, diarrhea today, shortness of breath, LEFT anterior chest pain EXAM: CHEST  2 VIEW COMPARISON:  12/28/2007 FINDINGS: Minimal enlargement of cardiac silhouette. Mediastinal contours and pulmonary vascularity  normal. Mild subsegmental atelectasis at LEFT lung base. Lungs clear. No pleural effusion or pneumothorax. Eventration of RIGHT diaphragm appears stable. Bones demineralized. IMPRESSION: Subsegmental atelectasis at LEFT lung base. Minimal enlargement of cardiac silhouette. Electronically Signed   By: Lavonia Dana M.D.   On: 01/27/2016 18:08   Ct Chest W Contrast  Result Date: 01/27/2016 CLINICAL DATA:  82 year old female with left lower chest left upper quadrant pain. EXAM: CT CHEST, ABDOMEN, AND PELVIS WITH CONTRAST TECHNIQUE: Multidetector CT imaging of the chest, abdomen and pelvis was performed following the standard protocol during bolus administration of intravenous contrast. CONTRAST:  163mL ISOVUE-300 IOPAMIDOL (ISOVUE-300) INJECTION 61% COMPARISON:  Abdominal radiograph dated 08/17/2014 and CT dated 05/05/2014 FINDINGS: CT CHEST FINDINGS Cardiovascular: There is no cardiomegaly or pericardial effusion. There is mild atherosclerotic calcification of the thoracic aorta. No aneurysmal dilatation or evidence of dissection. There is atherosclerotic calcification with partial luminal occlusion of the visualized proximal left vertebral artery, likely chronic. Correlation with clinical exam recommended. CT angiography may provide better evaluation if clinically indicated. The remainder of the visualized portion of the origins of the great vessels of the aortic arch appear patent. The central pulmonary artery is are patent as well. Mediastinum/Nodes: There is no hilar or mediastinal adenopathy. The esophagus is grossly unremarkable. Lungs/Pleura: Left lung base linear atelectasis/ scarring. The lungs are otherwise clear. There is no pleural effusion or pneumothorax. The central airways are patent. Musculoskeletal: There is no axillary adenopathy. The chest wall soft tissues appear unremarkable. There is scoliosis with osteopenia and degenerative changes of the spine. No acute fracture. CT ABDOMEN PELVIS FINDINGS  No intra-abdominal free air or free fluid. Hepatobiliary: Cholecystectomy. There is mild dilatation of the common bile duct, likely post cholecystectomy. The liver is unremarkable. Subcentimeter left hepatic hypodense lesion is too small to characterize but likely represents a cyst or hemangioma. Pancreas: Unremarkable. No pancreatic ductal dilatation or surrounding inflammatory changes. Spleen: There is a 1.7 x 3.0 cm wedge-shaped area of subcapsular hypodensity in the posterior aspect of the inferior spleen most compatible with a focal splenic infarct. There is no hematoma, drainable fluid collection, or abscess. Adrenals/Urinary Tract: An 8 mm left adrenal nodule, indeterminate, likely an adenoma. The right adrenal gland appears unremarkable. Multiple left renal parapelvic cysts noted. There is no hydronephrosis on either side. There is mild bilateral renal atrophy. The visualized ureters and urinary bladder appear unremarkable. Stomach/Bowel: There is extensive sigmoid diverticulosis with muscular hypertrophy. No active inflammatory changes. There is no evidence of bowel obstruction or active inflammation. Normal appendix. Vascular/Lymphatic: There is moderate aortoiliac atherosclerotic disease. The origins of  the celiac axis, SMA, IMA as well as the origins of the renal arteries appear patent. The SMV, splenic vein, and main portal vein are patent. No portal venous gas identified. There is no adenopathy. Reproductive: Hysterectomy. Other: None Musculoskeletal: Osteopenia with degenerative changes of the spine. No acute fracture. IMPRESSION: Small focal wedge-shaped splenic hypodensity most compatible with an infarct. Clinical correlation is recommended. No drainable fluid collection or abscess or hematoma. No other acute intrathoracic, abdominal, or pelvic pathology identified. Electronically Signed   By: Anner Crete M.D.   On: 01/27/2016 22:54   Ct Abdomen Pelvis W Contrast  Result Date:  01/27/2016 CLINICAL DATA:  82 year old female with left lower chest left upper quadrant pain. EXAM: CT CHEST, ABDOMEN, AND PELVIS WITH CONTRAST TECHNIQUE: Multidetector CT imaging of the chest, abdomen and pelvis was performed following the standard protocol during bolus administration of intravenous contrast. CONTRAST:  195mL ISOVUE-300 IOPAMIDOL (ISOVUE-300) INJECTION 61% COMPARISON:  Abdominal radiograph dated 08/17/2014 and CT dated 05/05/2014 FINDINGS: CT CHEST FINDINGS Cardiovascular: There is no cardiomegaly or pericardial effusion. There is mild atherosclerotic calcification of the thoracic aorta. No aneurysmal dilatation or evidence of dissection. There is atherosclerotic calcification with partial luminal occlusion of the visualized proximal left vertebral artery, likely chronic. Correlation with clinical exam recommended. CT angiography may provide better evaluation if clinically indicated. The remainder of the visualized portion of the origins of the great vessels of the aortic arch appear patent. The central pulmonary artery is are patent as well. Mediastinum/Nodes: There is no hilar or mediastinal adenopathy. The esophagus is grossly unremarkable. Lungs/Pleura: Left lung base linear atelectasis/ scarring. The lungs are otherwise clear. There is no pleural effusion or pneumothorax. The central airways are patent. Musculoskeletal: There is no axillary adenopathy. The chest wall soft tissues appear unremarkable. There is scoliosis with osteopenia and degenerative changes of the spine. No acute fracture. CT ABDOMEN PELVIS FINDINGS No intra-abdominal free air or free fluid. Hepatobiliary: Cholecystectomy. There is mild dilatation of the common bile duct, likely post cholecystectomy. The liver is unremarkable. Subcentimeter left hepatic hypodense lesion is too small to characterize but likely represents a cyst or hemangioma. Pancreas: Unremarkable. No pancreatic ductal dilatation or surrounding inflammatory  changes. Spleen: There is a 1.7 x 3.0 cm wedge-shaped area of subcapsular hypodensity in the posterior aspect of the inferior spleen most compatible with a focal splenic infarct. There is no hematoma, drainable fluid collection, or abscess. Adrenals/Urinary Tract: An 8 mm left adrenal nodule, indeterminate, likely an adenoma. The right adrenal gland appears unremarkable. Multiple left renal parapelvic cysts noted. There is no hydronephrosis on either side. There is mild bilateral renal atrophy. The visualized ureters and urinary bladder appear unremarkable. Stomach/Bowel: There is extensive sigmoid diverticulosis with muscular hypertrophy. No active inflammatory changes. There is no evidence of bowel obstruction or active inflammation. Normal appendix. Vascular/Lymphatic: There is moderate aortoiliac atherosclerotic disease. The origins of the celiac axis, SMA, IMA as well as the origins of the renal arteries appear patent. The SMV, splenic vein, and main portal vein are patent. No portal venous gas identified. There is no adenopathy. Reproductive: Hysterectomy. Other: None Musculoskeletal: Osteopenia with degenerative changes of the spine. No acute fracture. IMPRESSION: Small focal wedge-shaped splenic hypodensity most compatible with an infarct. Clinical correlation is recommended. No drainable fluid collection or abscess or hematoma. No other acute intrathoracic, abdominal, or pelvic pathology identified. Electronically Signed   By: Anner Crete M.D.   On: 01/27/2016 22:54    ____________________________________________   PROCEDURES  Procedure(s) performed:  Procedures   Critical Care performed: No ____________________________________________   INITIAL IMPRESSION / ASSESSMENT AND PLAN / ED COURSE  Pertinent labs & imaging results that were available during my care of the patient were reviewed by me and considered in my medical decision making (see chart for details).  Normal vital signs  with irregular rhythm and probable atrial fibrillation.  The patient has a history of atrial fibrillation but it was thought to be postprocedural and she was only on anticoagulation for a short period of time.  She is not having chest pain although pneumonia in the left lower lobe is possible.  Her chest x-ray is nonspecific with some atelectasis.  I will obtain CT scans of her chest, abdomen, and pelvis with oral and IV contrast for further evaluation of both thoracic and abdominal etiologies of her pain.   Clinical Course as of Jan 27 2352  Fri Jan 27, 2016  2337 CT scan consistent with splenic laceration.  Discussed by phone with Dr. Burt Knack (surgery) who recommended pain control, but felt that the question of anticoagulation is best left to internal medicine since she essentially has "had a stroke of her spleen."  I discussed with the patient and her daughter her results and they do feel more comfortable with her staying in the hospital and I explained that I would be concerned about the possibility of her leaving and developing an acute CVA or other severe result of clotting due to her atrial fibrillation.  I discussed the case by phone with Dr. Jannifer Franklin the hospitalist who will admit for further evaluation and treatment  [CF]    Clinical Course User Index [CF] Hinda Kehr, MD    ____________________________________________  FINAL CLINICAL IMPRESSION(S) / ED DIAGNOSES  Final diagnoses:  Splenic infarction  LUQ abdominal pain  Atrial fibrillation, unspecified type [facility])     MEDICATIONS GIVEN DURING THIS VISIT:  Medications  morphine 4 MG/ML injection 2 mg (2 mg Intravenous Given 01/27/16 2102)  ondansetron (ZOFRAN) injection 4 mg (4 mg Intravenous Given 01/27/16 2101)  iopamidol (ISOVUE-300) 61 % injection 30 mL (30 mLs Oral Contrast Given 01/27/16 2100)  iopamidol (ISOVUE-300) 61 % injection 100 mL (100 mLs Intravenous Contrast Given 01/27/16 2209)     NEW OUTPATIENT MEDICATIONS  STARTED DURING THIS VISIT:  New Prescriptions   No medications on file    Modified Medications   No medications on file    Discontinued Medications   No medications on file     Note:  This document was prepared using Dragon voice recognition software and may include unintentional dictation errors.    Hinda Kehr, MD 01/27/16 [phone]

## 2016-01-27 NOTE — ED Notes (Signed)
Pt sitting in chair and does not want to undress.  Pt reports diarrhea and nausea last night.  Pt has left side abd pain and back pain.  No chest pain  Pt has intermittent sob.  Pt alert.  Family with pt.

## 2016-01-27 NOTE — ED Notes (Signed)
Pt finished po contrast  Ct aware.  Pt alert, laughing with family

## 2016-01-27 NOTE — H&P (Signed)
[facility] NAME: Loretta Johns    MR#:  [id number]  DATE OF BIRTH:  10/31/1931  DATE OF ADMISSION:  01/27/2016  PRIMARY CARE PHYSICIAN: ADAMS, Melba Coon, MD   REQUESTING/REFERRING PHYSICIAN: Karma Greaser, MD  CHIEF COMPLAINT:   Chief Complaint  Patient presents with  . Shortness of Breath    HISTORY OF PRESENT ILLNESS:  Loretta Johns  is a 82 y.o. female who presents with Shortness of breath and left upper quadrant abdominal pain. Evaluation here in the ED showed that the patient is likely in atrial fibrillation, and CT scan showed small splenic infarct. Patient states that she was diagnosed with A. fib at some point in the past when she was having a procedure done, and then shortly after that she was placed on anticoagulation, Xarelto and then Eliquis, but that medication became too expensive so she stopped taking it. Hospitals were called for admission  PAST MEDICAL HISTORY:   Past Medical History:  Diagnosis Date  . Breast cancer ([place])   . Cervicodynia   . Chronic back pain   . Chronic cystitis   . Dysphagia   . Esophageal reflux   . HBP (high blood pressure)   . History of augmentation mammoplasty   . History of renal stone   . Hypothyroidism   . IBS (irritable bowel syndrome)   . Incontinence   . Interstitial cystitis   . Microscopic hematuria   . Polio     PAST SURGICAL HISTORY:   Past Surgical History:  Procedure Laterality Date  . ABDOMINAL HYSTERECTOMY  1980  . BREAST ENHANCEMENT SURGERY    . CHOLECYSTECTOMY  1996  . CYSTOSCOPY  09/25/2009   with hydrodilation and bladder biopsy and fulgeration of trigone  . KNEE SURGERY Left   . Urethral polyps  07/01/1991    SOCIAL HISTORY:   Social History  Substance Use Topics  . Smoking status: Never Smoker  . Smokeless tobacco: Never Used  . Alcohol use No    FAMILY HISTORY:   Family History  Problem Relation Age of Onset  . CAD Mother    . Cancer Father   . Kidney disease Brother     Reanl cancer  . Prostate cancer Neg Hx     DRUG ALLERGIES:   Allergies  Allergen Reactions  . [place] Grass Allergy Skin Test   . Chocolate Flavor   . Midazolam   . Pravastatin Other (See Comments)    WAS UNABLE TO WALK PER PT.  Marland Kitchen Propoxyphene Napsylate [Propoxyphene]     Darvocet-N 100  . Reglan [Metoclopramide]     MEDICATIONS AT HOME:   Prior to Admission medications   Medication Sig Start Date End Date Taking? Authorizing Provider  dicyclomine (BENTYL) 10 MG capsule Take 10 mg by mouth 4 (four) times daily -  before meals and at bedtime.    Historical Provider, MD  diltiazem (CARDIZEM CD) 120 MG 24 hr capsule Take 1 capsule by mouth daily. 07/23/14 07/23/15  Historical Provider, MD  diltiazem (CARDIZEM CD) 120 MG 24 hr capsule Take 120 mg by mouth daily. 01/09/16   Historical Provider, MD  donepezil (ARICEPT) 10 MG tablet Take 10 mg by mouth at bedtime. 12/30/15   Historical Provider, MD  hydrochlorothiazide (MICROZIDE) 12.5 MG capsule Take 12.5 mg by mouth daily.    Historical Provider, MD  HYDROcodone-acetaminophen (NORCO) 10-325 MG per tablet Take 1 tablet by mouth every 6 (six) hours as needed.  Historical Provider, MD  HYOSCYAMINE SULFATE PO Take by mouth.    Historical Provider, MD  mirabegron ER (MYRBETRIQ) 25 MG TB24 tablet Take 25 mg by mouth daily.    Historical Provider, MD  ranitidine (ZANTAC) 150 MG capsule Take 150 mg by mouth. 02/02/13   Historical Provider, MD  solifenacin (VESICARE) 5 MG tablet Take 5 mg by mouth daily.    Historical Provider, MD  SYNTHROID 137 MCG tablet TAKE 137 [place]. 07/24/14   Historical Provider, MD    REVIEW OF SYSTEMS:  Review of Systems  Constitutional: Negative for chills, fever, malaise/fatigue and weight loss.  HENT: Negative for ear pain, hearing loss and tinnitus.   Eyes: Negative for blurred vision, double vision, pain and redness.  Respiratory: Negative for  cough, hemoptysis and shortness of breath.   Cardiovascular: Negative for chest pain, palpitations, orthopnea and leg swelling.  Gastrointestinal: Positive for abdominal pain. Negative for constipation, diarrhea, nausea and vomiting.  Genitourinary: Negative for dysuria, frequency and hematuria.  Musculoskeletal: Negative for back pain, joint pain and neck pain.  Skin:       No acne, rash, or lesions  Neurological: Negative for dizziness, tremors, focal weakness and weakness.  Endo/Heme/Allergies: Negative for polydipsia. Does not bruise/bleed easily.  Psychiatric/Behavioral: Negative for depression. The patient is not nervous/anxious and does not have insomnia.      VITAL SIGNS:   Vitals:   01/27/16 2048 01/27/16 2200 01/27/16 2300 01/27/16 2330  BP:  124/62 112/62 122/66  Pulse: 67 61 89 73  Resp: 18 16    Temp:      TempSrc:      SpO2: 97% 97% 96% 94%  Weight:      Height:       Wt Readings from Last 3 Encounters:  01/27/16 72.6 kg (160 lb)  05/31/15 72.6 kg (160 lb)    PHYSICAL EXAMINATION:  Physical Exam  Vitals reviewed. Constitutional: She is oriented to person, place, and time. She appears well-developed and well-nourished. No distress.  HENT:  Head: Normocephalic and atraumatic.  Mouth/Throat: Oropharynx is clear and moist.  Eyes: Conjunctivae and EOM are normal. Pupils are equal, round, and reactive to light. No scleral icterus.  Neck: Normal range of motion. Neck supple. No JVD present. No thyromegaly present.  Cardiovascular: Normal rate and intact distal pulses.  Exam reveals no gallop and no friction rub.   No murmur heard. Irregular rhythm  Respiratory: Effort normal and breath sounds normal. No respiratory distress. She has no wheezes. She has no rales.  GI: Soft. Bowel sounds are normal. She exhibits no distension. There is tenderness.  Musculoskeletal: Normal range of motion. She exhibits no edema.  No arthritis, no gout  Lymphadenopathy:    She has no  cervical adenopathy.  Neurological: She is alert and oriented to person, place, and time. No cranial nerve deficit.  No dysarthria, no aphasia  Skin: Skin is warm and dry. No rash noted. No erythema.  Psychiatric: She has a normal mood and affect. Her behavior is normal. Judgment and thought content normal.    LABORATORY PANEL:   CBC  Recent Labs Lab 01/27/16 1726  WBC 8.6  HGB 14.1  HCT 40.7  PLT 216   ------------------------------------------------------------------------------------------------------------------  Chemistries   Recent Labs Lab 01/27/16 1726  NA 135  K 3.5  CL 104  CO2 24  GLUCOSE 113*  BUN 14  CREATININE 0.77  CALCIUM 9.0  AST 30  ALT 15  ALKPHOS 75  BILITOT 0.8   ------------------------------------------------------------------------------------------------------------------  Cardiac Enzymes  Recent Labs Lab 01/27/16 1726  TROPONINI <0.03   ------------------------------------------------------------------------------------------------------------------  RADIOLOGY:  Dg Chest 2 View  Result Date: 01/27/2016 CLINICAL DATA:  Nausea since last night, diarrhea today, shortness of breath, LEFT anterior chest pain EXAM: CHEST  2 VIEW COMPARISON:  12/28/2007 FINDINGS: Minimal enlargement of cardiac silhouette. Mediastinal contours and pulmonary vascularity normal. Mild subsegmental atelectasis at LEFT lung base. Lungs clear. No pleural effusion or pneumothorax. Eventration of RIGHT diaphragm appears stable. Bones demineralized. IMPRESSION: Subsegmental atelectasis at LEFT lung base. Minimal enlargement of cardiac silhouette. Electronically Signed   By: Lavonia Dana M.D.   On: 01/27/2016 18:08   Ct Chest W Contrast  Result Date: 01/27/2016 CLINICAL DATA:  82 year old female with left lower chest left upper quadrant pain. EXAM: CT CHEST, ABDOMEN, AND PELVIS WITH CONTRAST TECHNIQUE: Multidetector CT imaging of the chest, abdomen and pelvis was  performed following the standard protocol during bolus administration of intravenous contrast. CONTRAST:  123mL ISOVUE-300 IOPAMIDOL (ISOVUE-300) INJECTION 61% COMPARISON:  Abdominal radiograph dated 08/17/2014 and CT dated 05/05/2014 FINDINGS: CT CHEST FINDINGS Cardiovascular: There is no cardiomegaly or pericardial effusion. There is mild atherosclerotic calcification of the thoracic aorta. No aneurysmal dilatation or evidence of dissection. There is atherosclerotic calcification with partial luminal occlusion of the visualized proximal left vertebral artery, likely chronic. Correlation with clinical exam recommended. CT angiography may provide better evaluation if clinically indicated. The remainder of the visualized portion of the origins of the great vessels of the aortic arch appear patent. The central pulmonary artery is are patent as well. Mediastinum/Nodes: There is no hilar or mediastinal adenopathy. The esophagus is grossly unremarkable. Lungs/Pleura: Left lung base linear atelectasis/ scarring. The lungs are otherwise clear. There is no pleural effusion or pneumothorax. The central airways are patent. Musculoskeletal: There is no axillary adenopathy. The chest wall soft tissues appear unremarkable. There is scoliosis with osteopenia and degenerative changes of the spine. No acute fracture. CT ABDOMEN PELVIS FINDINGS No intra-abdominal free air or free fluid. Hepatobiliary: Cholecystectomy. There is mild dilatation of the common bile duct, likely post cholecystectomy. The liver is unremarkable. Subcentimeter left hepatic hypodense lesion is too small to characterize but likely represents a cyst or hemangioma. Pancreas: Unremarkable. No pancreatic ductal dilatation or surrounding inflammatory changes. Spleen: There is a 1.7 x 3.0 cm wedge-shaped area of subcapsular hypodensity in the posterior aspect of the inferior spleen most compatible with a focal splenic infarct. There is no hematoma, drainable fluid  collection, or abscess. Adrenals/Urinary Tract: An 8 mm left adrenal nodule, indeterminate, likely an adenoma. The right adrenal gland appears unremarkable. Multiple left renal parapelvic cysts noted. There is no hydronephrosis on either side. There is mild bilateral renal atrophy. The visualized ureters and urinary bladder appear unremarkable. Stomach/Bowel: There is extensive sigmoid diverticulosis with muscular hypertrophy. No active inflammatory changes. There is no evidence of bowel obstruction or active inflammation. Normal appendix. Vascular/Lymphatic: There is moderate aortoiliac atherosclerotic disease. The origins of the celiac axis, SMA, IMA as well as the origins of the renal arteries appear patent. The SMV, splenic vein, and main portal vein are patent. No portal venous gas identified. There is no adenopathy. Reproductive: Hysterectomy. Other: None Musculoskeletal: Osteopenia with degenerative changes of the spine. No acute fracture. IMPRESSION: Small focal wedge-shaped splenic hypodensity most compatible with an infarct. Clinical correlation is recommended. No drainable fluid collection or abscess or hematoma. No other acute intrathoracic, abdominal, or pelvic pathology identified. Electronically Signed   By: Milas Hock  Radparvar M.D.   On: 01/27/2016 22:54   Ct Abdomen Pelvis W Contrast  Result Date: 01/27/2016 CLINICAL DATA:  82 year old female with left lower chest left upper quadrant pain. EXAM: CT CHEST, ABDOMEN, AND PELVIS WITH CONTRAST TECHNIQUE: Multidetector CT imaging of the chest, abdomen and pelvis was performed following the standard protocol during bolus administration of intravenous contrast. CONTRAST:  127mL ISOVUE-300 IOPAMIDOL (ISOVUE-300) INJECTION 61% COMPARISON:  Abdominal radiograph dated 08/17/2014 and CT dated 05/05/2014 FINDINGS: CT CHEST FINDINGS Cardiovascular: There is no cardiomegaly or pericardial effusion. There is mild atherosclerotic calcification of the thoracic  aorta. No aneurysmal dilatation or evidence of dissection. There is atherosclerotic calcification with partial luminal occlusion of the visualized proximal left vertebral artery, likely chronic. Correlation with clinical exam recommended. CT angiography may provide better evaluation if clinically indicated. The remainder of the visualized portion of the origins of the great vessels of the aortic arch appear patent. The central pulmonary artery is are patent as well. Mediastinum/Nodes: There is no hilar or mediastinal adenopathy. The esophagus is grossly unremarkable. Lungs/Pleura: Left lung base linear atelectasis/ scarring. The lungs are otherwise clear. There is no pleural effusion or pneumothorax. The central airways are patent. Musculoskeletal: There is no axillary adenopathy. The chest wall soft tissues appear unremarkable. There is scoliosis with osteopenia and degenerative changes of the spine. No acute fracture. CT ABDOMEN PELVIS FINDINGS No intra-abdominal free air or free fluid. Hepatobiliary: Cholecystectomy. There is mild dilatation of the common bile duct, likely post cholecystectomy. The liver is unremarkable. Subcentimeter left hepatic hypodense lesion is too small to characterize but likely represents a cyst or hemangioma. Pancreas: Unremarkable. No pancreatic ductal dilatation or surrounding inflammatory changes. Spleen: There is a 1.7 x 3.0 cm wedge-shaped area of subcapsular hypodensity in the posterior aspect of the inferior spleen most compatible with a focal splenic infarct. There is no hematoma, drainable fluid collection, or abscess. Adrenals/Urinary Tract: An 8 mm left adrenal nodule, indeterminate, likely an adenoma. The right adrenal gland appears unremarkable. Multiple left renal parapelvic cysts noted. There is no hydronephrosis on either side. There is mild bilateral renal atrophy. The visualized ureters and urinary bladder appear unremarkable. Stomach/Bowel: There is extensive sigmoid  diverticulosis with muscular hypertrophy. No active inflammatory changes. There is no evidence of bowel obstruction or active inflammation. Normal appendix. Vascular/Lymphatic: There is moderate aortoiliac atherosclerotic disease. The origins of the celiac axis, SMA, IMA as well as the origins of the renal arteries appear patent. The SMV, splenic vein, and main portal vein are patent. No portal venous gas identified. There is no adenopathy. Reproductive: Hysterectomy. Other: None Musculoskeletal: Osteopenia with degenerative changes of the spine. No acute fracture. IMPRESSION: Small focal wedge-shaped splenic hypodensity most compatible with an infarct. Clinical correlation is recommended. No drainable fluid collection or abscess or hematoma. No other acute intrathoracic, abdominal, or pelvic pathology identified. Electronically Signed   By: Anner Crete M.D.   On: 01/27/2016 22:54    EKG:   Orders placed or performed during the hospital encounter of 01/27/16  . ED EKG within 10 minutes  . ED EKG within 10 minutes    IMPRESSION AND PLAN:  Principal Problem:   Splenic infarct - likely due to embolus from her A. fib. No surgical intervention required at this time. Treatment as below. Active Problems:   AF (paroxysmal atrial fibrillation) ([place]) - we will start her on a heparin drip tonight, and get a cardiology consult for the morning for recommendation for further anticoagulation and follow-up   Essential (  primary) hypertension - continue home meds   Acquired hypothyroidism - home dose thyroid replacement    All the records are reviewed and case discussed with ED provider. Management plans discussed with the patient and/or family.  DVT PROPHYLAXIS: Systemic anticoagulation  GI PROPHYLAXIS: None  ADMISSION STATUS: Observation  CODE STATUS: Full Code Status History    This patient does not have a recorded code status. Please follow your organizational policy for patients in this  situation.    Advance Directive Documentation   Flowsheet Row Most Recent Value  Type of Advance Directive  Healthcare Power of Attorney, Living will  Pre-existing out of facility DNR order (yellow form or pink MOST form)  No data  "MOST" Form in Place?  No data      TOTAL TIME TAKING CARE OF THIS PATIENT: 40 minutes.    Shamon Lobo FIELDING 01/27/2016, 11:57 PM  [facility]  Office  [phone]  CC: Primary care physician; ADAMS, Melba Coon, MD

## 2016-01-27 NOTE — ED Notes (Signed)
Report from amy, rn.

## 2016-01-27 NOTE — ED Triage Notes (Signed)
Pt comes into the ED via POV c/o shortness of breath when she takes a deep breath.  Patient was seen at [facility] urgent care in [facility] where they sent her here for a CT scan.  Patient states this started yesterday.  Patient in NAD at this time with even and unlabored respirations.  Patient explains that the pain stays local to the left side of her rib.

## 2016-01-27 NOTE — ED Notes (Signed)
Pt repositioned in bed for comfort. Call bell at right side.

## 2016-01-27 NOTE — ED Notes (Signed)
Pt continues take blood pressure cuff and pulse ox off.  Explained to pt several times why equipment is on,but continues to take it.  Family with pt.

## 2016-01-28 ENCOUNTER — Observation Stay
Admit: 2016-01-28 | Discharge: 2016-01-28 | Disposition: A | Discharge status: Home / Self Care | Payer: Medicare PPO | Attending: Internal Medicine | Admitting: Internal Medicine

## 2016-01-28 DIAGNOSIS — M419 Scoliosis, unspecified: Secondary | ICD-10-CM | POA: Diagnosis present

## 2016-01-28 DIAGNOSIS — I10 Essential (primary) hypertension: Secondary | ICD-10-CM | POA: Diagnosis present

## 2016-01-28 DIAGNOSIS — K219 Gastro-esophageal reflux disease without esophagitis: Secondary | ICD-10-CM | POA: Diagnosis present

## 2016-01-28 DIAGNOSIS — Z853 Personal history of malignant neoplasm of breast: Secondary | ICD-10-CM | POA: Diagnosis not present

## 2016-01-28 DIAGNOSIS — I700 Atherosclerosis of aorta: Secondary | ICD-10-CM | POA: Diagnosis present

## 2016-01-28 DIAGNOSIS — Z79899 Other long term (current) drug therapy: Secondary | ICD-10-CM | POA: Diagnosis not present

## 2016-01-28 DIAGNOSIS — Z8249 Family history of ischemic heart disease and other diseases of the circulatory system: Secondary | ICD-10-CM | POA: Diagnosis not present

## 2016-01-28 DIAGNOSIS — K589 Irritable bowel syndrome without diarrhea: Secondary | ICD-10-CM | POA: Diagnosis present

## 2016-01-28 DIAGNOSIS — Z888 Allergy status to other drugs, medicaments and biological substances status: Secondary | ICD-10-CM | POA: Diagnosis not present

## 2016-01-28 DIAGNOSIS — G8929 Other chronic pain: Secondary | ICD-10-CM | POA: Diagnosis present

## 2016-01-28 DIAGNOSIS — R110 Nausea: Secondary | ICD-10-CM | POA: Diagnosis present

## 2016-01-28 DIAGNOSIS — D735 Infarction of spleen: Secondary | ICD-10-CM | POA: Diagnosis present

## 2016-01-28 DIAGNOSIS — M549 Dorsalgia, unspecified: Secondary | ICD-10-CM | POA: Diagnosis present

## 2016-01-28 DIAGNOSIS — Z91018 Allergy to other foods: Secondary | ICD-10-CM | POA: Diagnosis not present

## 2016-01-28 DIAGNOSIS — I480 Paroxysmal atrial fibrillation: Secondary | ICD-10-CM | POA: Diagnosis present

## 2016-01-28 DIAGNOSIS — E039 Hypothyroidism, unspecified: Secondary | ICD-10-CM | POA: Diagnosis present

## 2016-01-28 LAB — CBC
HEMATOCRIT: 37.4 % (ref 35.0–47.0)
HEMOGLOBIN: 12.9 g/dL (ref 12.0–16.0)
MCH: 32.2 pg (ref 26.0–34.0)
MCHC: 34.4 g/dL (ref 32.0–36.0)
MCV: 93.7 fL (ref 80.0–100.0)
Platelets: 184 10*3/uL (ref 150–440)
RBC: 3.99 MIL/uL (ref 3.80–5.20)
RDW: 12.9 % (ref 11.5–14.5)
WBC: 6.8 10*3/uL (ref 3.6–11.0)

## 2016-01-28 LAB — BASIC METABOLIC PANEL
ANION GAP: 5 (ref 5–15)
BUN: 13 mg/dL (ref 6–20)
CHLORIDE: 104 mmol/L (ref 101–111)
CO2: 28 mmol/L (ref 22–32)
Calcium: 8.4 mg/dL — ABNORMAL LOW (ref 8.9–10.3)
Creatinine, Ser: 0.76 mg/dL (ref 0.44–1.00)
GFR calc non Af Amer: >60 mL/min (ref >60)
GLUCOSE: 114 mg/dL — AB (ref 65–99)
POTASSIUM: 3.5 mmol/L (ref 3.5–5.1)
Sodium: 137 mmol/L (ref 135–145)

## 2016-01-28 LAB — HEPARIN LEVEL (UNFRACTIONATED): HEPARIN UNFRACTIONATED: 0.93 [IU]/mL — AB (ref 0.30–0.70)

## 2016-01-28 LAB — ECHOCARDIOGRAM COMPLETE
HEIGHTINCHES: 62 in
WEIGHTICAEL: 2560 [oz_av]

## 2016-01-28 LAB — APTT: APTT: 34 s (ref 24–36)

## 2016-01-28 LAB — PROTIME-INR
INR: 1.07
Prothrombin Time: 13.9 seconds (ref 11.4–15.2)

## 2016-01-28 MED ORDER — DICYCLOMINE HCL 10 MG PO CAPS
10.0000 mg | ORAL_CAPSULE | Freq: Three times a day (TID) | ORAL | Status: DC
  Administered 2016-01-28 (×3): 10 mg via ORAL
  Filled 2016-01-28 (×3): qty 1

## 2016-01-28 MED ORDER — ONDANSETRON HCL 4 MG PO TABS: 4.0000 mg | ORAL_TABLET | Freq: Four times a day (QID) | ORAL | 0 refills | Status: DC | PRN

## 2016-01-28 MED ORDER — DILTIAZEM HCL ER COATED BEADS 120 MG PO CP24
120.0000 mg | ORAL_CAPSULE | Freq: Every day | ORAL | Status: DC
  Administered 2016-01-28: 10:00:00 120 mg via ORAL
  Filled 2016-01-28: qty 1

## 2016-01-28 MED ORDER — HEPARIN BOLUS VIA INFUSION
3900.0000 [IU] | Freq: Once | INTRAVENOUS | Status: AC
  Administered 2016-01-28: 03:00:00 3900 [IU] via INTRAVENOUS
  Filled 2016-01-28: qty 3900

## 2016-01-28 MED ORDER — PROCHLORPERAZINE EDISYLATE 5 MG/ML IJ SOLN
10.0000 mg | Freq: Four times a day (QID) | INTRAMUSCULAR | Status: DC | PRN
  Administered 2016-01-28: 10:00:00 10 mg via INTRAVENOUS
  Filled 2016-01-28: qty 2

## 2016-01-28 MED ORDER — ACETAMINOPHEN 650 MG RE SUPP: 650.0000 mg | Freq: Four times a day (QID) | RECTAL | Status: DC | PRN

## 2016-01-28 MED ORDER — ONDANSETRON HCL 4 MG PO TABS: 4.0000 mg | ORAL_TABLET | Freq: Four times a day (QID) | ORAL | Status: DC | PRN

## 2016-01-28 MED ORDER — ONDANSETRON HCL 4 MG/2ML IJ SOLN
4.0000 mg | Freq: Four times a day (QID) | INTRAMUSCULAR | Status: DC | PRN
  Administered 2016-01-28: 07:00:00 4 mg via INTRAVENOUS
  Filled 2016-01-28: qty 2

## 2016-01-28 MED ORDER — MIRABEGRON ER 25 MG PO TB24
25.0000 mg | ORAL_TABLET | Freq: Every day | ORAL | Status: DC
  Administered 2016-01-28: 10:00:00 25 mg via ORAL
  Filled 2016-01-28: qty 1

## 2016-01-28 MED ORDER — APIXABAN 5 MG PO TABS: 5.0000 mg | ORAL_TABLET | Freq: Two times a day (BID) | ORAL | 0 refills | Status: DC

## 2016-01-28 MED ORDER — FAMOTIDINE 20 MG PO TABS
20.0000 mg | ORAL_TABLET | Freq: Every day | ORAL | Status: DC
  Administered 2016-01-28: 20 mg via ORAL
  Filled 2016-01-28: qty 1

## 2016-01-28 MED ORDER — APIXABAN 5 MG PO TABS
5.0000 mg | ORAL_TABLET | Freq: Once | ORAL | Status: AC
  Administered 2016-01-28: 17:00:00 5 mg via ORAL
  Filled 2016-01-28: qty 1

## 2016-01-28 MED ORDER — LEVOTHYROXINE SODIUM 112 MCG PO TABS
137.0000 ug | ORAL_TABLET | Freq: Every day | ORAL | Status: DC
  Administered 2016-01-28: 137 ug via ORAL
  Filled 2016-01-28: qty 1

## 2016-01-28 MED ORDER — HYDROCODONE-ACETAMINOPHEN 10-325 MG PO TABS
1.0000 | ORAL_TABLET | Freq: Four times a day (QID) | ORAL | Status: DC | PRN
  Administered 2016-01-28 (×2): 1 via ORAL
  Filled 2016-01-28 (×2): qty 1

## 2016-01-28 MED ORDER — ACETAMINOPHEN 325 MG PO TABS
650.0000 mg | ORAL_TABLET | Freq: Four times a day (QID) | ORAL | Status: DC | PRN
  Administered 2016-01-28: 650 mg via ORAL
  Filled 2016-01-28: qty 2

## 2016-01-28 MED ORDER — HYDROCODONE-ACETAMINOPHEN 10-325 MG PO TABS: 0.5000 | ORAL_TABLET | Freq: Four times a day (QID) | ORAL | 0 refills | Status: DC | PRN

## 2016-01-28 MED ORDER — HEPARIN (PORCINE) IN NACL 100-0.45 UNIT/ML-% IJ SOLN
850.0000 [IU]/h | INTRAMUSCULAR | Status: DC
  Administered 2016-01-28: 03:00:00 1000 [IU]/h via INTRAVENOUS
  Filled 2016-01-28: qty 250

## 2016-01-28 MED ORDER — DICYCLOMINE HCL 10 MG PO CAPS: 10.0000 mg | ORAL_CAPSULE | Freq: Three times a day (TID) | ORAL | 0 refills | Status: DC

## 2016-01-28 MED ORDER — DONEPEZIL HCL 5 MG PO TABS
10.0000 mg | ORAL_TABLET | Freq: Every day | ORAL | Status: DC
  Administered 2016-01-28: 10 mg via ORAL
  Filled 2016-01-28: qty 2

## 2016-01-28 NOTE — Plan of Care (Signed)
Problem: Education: Goal: Knowledge of [facility] Education information/materials will improve Outcome: Progressing Pt likes to be called [place]  Past Medical History:  Diagnosis Date  . Breast cancer ([place])   . Cervicodynia   . Chronic back pain   . Chronic cystitis   . Dysphagia   . Esophageal reflux   . HBP (high blood pressure)   . History of augmentation mammoplasty   . History of renal stone   . Hypothyroidism   . IBS (irritable bowel syndrome)   . Incontinence   . Interstitial cystitis   . Microscopic hematuria   . Polio    Pt is well controlled with home medications

## 2016-01-28 NOTE — ED Notes (Signed)
Pt updated on admission process. Pt verbalizes understanding.  

## 2016-01-28 NOTE — Progress Notes (Signed)
ANTICOAGULATION CONSULT NOTE -FOLLOW UP   Pharmacy Consult for Heparin  Indication: atrial fibrillation  Allergies  Allergen Reactions  . [place] Grass Allergy Skin Test Itching  . Chocolate Flavor Other (See Comments)    Reaction: unknown  . Midazolam Other (See Comments)    Reaction: trouble waking up  . Pravastatin Other (See Comments)    WAS UNABLE TO WALK PER PT.  Marland Kitchen Propoxyphene Napsylate [Propoxyphene] Other (See Comments)    Darvocet-N 100 Reaction: unknown  . Reglan [Metoclopramide] Other (See Comments)    Reaction: felt like tongue was "drawing up"    Patient Measurements: Height: 5\' 2"  (157.5 cm) Weight: 160 lb (72.6 kg) IBW/kg (Calculated) : 50.1 Heparin Dosing Weight: 65.6 kg   Vital Signs: Temp: 98.4 F (36.9 C) (12/16 0857) Temp Source: Oral (12/16 0857) BP: 116/63 (12/16 0859) Pulse Rate: 97 (12/16 0859)  Labs:  Recent Labs  01/27/16 1726 01/28/16 0313 01/28/16 1140  HGB 14.1 12.9  --   HCT 40.7 37.4  --   PLT 216 184  --   APTT  --  34  --   LABPROT  --  13.9  --   INR  --  1.07  --   HEPARINUNFRC  --   --  0.93*  CREATININE 0.77 0.76  --   TROPONINI <0.03  --   --     Estimated Creatinine Clearance: 50.6 mL/min (by C-G formula based on SCr of 0.76 mg/dL).   Medical History: Past Medical History:  Diagnosis Date  . Breast cancer ([place])   . Cervicodynia   . Chronic back pain   . Chronic cystitis   . Dysphagia   . Esophageal reflux   . HBP (high blood pressure)   . History of augmentation mammoplasty   . History of renal stone   . Hypothyroidism   . IBS (irritable bowel syndrome)   . Incontinence   . Interstitial cystitis   . Microscopic hematuria   . Polio     Medications:  Prescriptions Prior to Admission  Medication Sig Dispense Refill Last Dose  . dicyclomine (BENTYL) 10 MG capsule Take 10 mg by mouth 4 (four) times daily -  before meals and at bedtime.   01/27/2016 at Unknown time  . diltiazem (CARDIZEM CD) 120 MG 24 hr  capsule Take 120 mg by mouth daily.   01/27/2016 at Unknown time  . donepezil (ARICEPT) 10 MG tablet Take 10 mg by mouth at bedtime.  5 01/27/2016 at Unknown time  . hydrochlorothiazide (MICROZIDE) 12.5 MG capsule Take 12.5 mg by mouth daily.   01/27/2016 at Unknown time  . hyoscyamine (LEVSIN, ANASPAZ) 0.125 MG tablet Take 0.125 mg by mouth every 4 (four) hours as needed for bladder spasms.    prn at prn  . levothyroxine (SYNTHROID, LEVOTHROID) 125 MCG tablet Take 125 mcg by mouth daily before breakfast.   01/27/2016 at Unknown time  . [DISCONTINUED] HYDROcodone-acetaminophen (NORCO) 10-325 MG per tablet Take 0.5-1 tablets by mouth every 6 (six) hours as needed for severe pain.    prn at prn    Assessment: CrCl = 50.6 ml/min  Goal of Therapy:  Heparin level 0.3-0.7 units/ml Monitor platelets by anticoagulation protocol: Yes   Plan:  Give 3900 units bolus x 1 Start heparin infusion at 1000 units/hr Check anti-Xa level in 8 hours and daily while on heparin Continue to monitor H&H and platelets   12/16 Heparin level @ 11:40 resulted @ 0.93. Will decrease heparin gtt rate to 850  units/hr. Will recheck Heparin level @ 2100.  Aundrey Elahi D 01/28/2016,1:05 PM

## 2016-01-28 NOTE — Discharge Instructions (Signed)
Resume diet and activity as before ° ° °

## 2016-01-28 NOTE — Progress Notes (Signed)
PT requested and received prayer. CH provided conversation and prayer before discharge.

## 2016-01-28 NOTE — Progress Notes (Signed)
Pt is being discharged. Discharge papers given and explained to pt and pt's daughter. Both verbalized understanding. Meds and f/u appointment reviewed with pt. RX given.

## 2016-01-28 NOTE — Progress Notes (Signed)
*  PRELIMINARY RESULTS* Echocardiogram 2D Echocardiogram has been performed.  Loretta Johns Loretta Johns 01/28/2016, 11:03 AM

## 2016-01-28 NOTE — Consult Note (Signed)
Reason for Consult:ARIB Splenic infarct Referring Physician: Hospitalist Dr Lavetta Nielsen,   [place] Loretta Johns is an 82 y.o. female.  HPI: Pt presented to the hospital with abdominal pain and AFIB. LUQ pain. W/U suggested splenic infarct probably from AFIB.he was evaluated for AFIB in the past but switched from xarelto and Eliquis to not anticoug. She states to have had difficulty was expense of the drugs. She c/o of mild palp but no CP. She feels better now with reduced pain. There is still irreg heart beats.  Past Medical History:  Diagnosis Date  . Breast cancer ([place])   . Cervicodynia   . Chronic back pain   . Chronic cystitis   . Dysphagia   . Esophageal reflux   . HBP (high blood pressure)   . History of augmentation mammoplasty   . History of renal stone   . Hypothyroidism   . IBS (irritable bowel syndrome)   . Incontinence   . Interstitial cystitis   . Microscopic hematuria   . Polio     Past Surgical History:  Procedure Laterality Date  . ABDOMINAL HYSTERECTOMY  1980  . BREAST ENHANCEMENT SURGERY    . CHOLECYSTECTOMY  1996  . CYSTOSCOPY  09/25/2009   with hydrodilation and bladder biopsy and fulgeration of trigone  . KNEE SURGERY Left   . Urethral polyps  07/01/1991    Family History  Problem Relation Age of Onset  . CAD Mother   . Cancer Father   . Kidney disease Brother     Reanl cancer  . Prostate cancer Neg Hx     Social History:  reports that she has never smoked. She has never used smokeless tobacco. She reports that she does not drink alcohol or use drugs.  Allergies:  Allergies  Allergen Reactions  . [place] Grass Allergy Skin Test Itching  . Chocolate Flavor Other (See Comments)    Reaction: unknown  . Midazolam Other (See Comments)    Reaction: trouble waking up  . Pravastatin Other (See Comments)    WAS UNABLE TO WALK PER PT.  Marland Kitchen Propoxyphene Napsylate [Propoxyphene] Other (See Comments)    Darvocet-N 100 Reaction: unknown  . Reglan  [Metoclopramide] Other (See Comments)    Reaction: felt like tongue was "drawing up"    Medications: I have reviewed the patient's current medications.  Results for orders placed or performed during the hospital encounter of 01/27/16 (from the past 48 hour(s))  Basic metabolic panel     Status: Abnormal   Collection Time: 01/27/16  5:26 PM  Result Value Ref Range   Sodium 135 135 - 145 mmol/L   Potassium 3.5 3.5 - 5.1 mmol/L   Chloride 104 101 - 111 mmol/L   CO2 24 22 - 32 mmol/L   Glucose, Bld 113 (H) 65 - 99 mg/dL   BUN 14 6 - 20 mg/dL   Creatinine, Ser 0.77 0.44 - 1.00 mg/dL   Calcium 9.0 8.9 - 10.3 mg/dL   GFR calc non Af Amer >60 >60 mL/min   GFR calc Af Amer >60 >60 mL/min    Comment: (NOTE) The eGFR has been calculated using the CKD EPI equation. This calculation has not been validated in all clinical situations. eGFR's persistently <60 mL/min signify possible Chronic Kidney Disease.    Anion gap 7 5 - 15  CBC     Status: None   Collection Time: 01/27/16  5:26 PM  Result Value Ref Range   WBC 8.6 3.6 - 11.0 K/uL  RBC 4.41 3.80 - 5.20 MIL/uL   Hemoglobin 14.1 12.0 - 16.0 g/dL   HCT 40.7 35.0 - 47.0 %   MCV 92.3 80.0 - 100.0 fL   MCH 32.1 26.0 - 34.0 pg   MCHC 34.8 32.0 - 36.0 g/dL   RDW 12.7 11.5 - 14.5 %   Platelets 216 150 - 440 K/uL  Troponin I     Status: None   Collection Time: 01/27/16  5:26 PM  Result Value Ref Range   Troponin I <0.03 <0.03 ng/mL  Hepatic function panel     Status: None   Collection Time: 01/27/16  5:26 PM  Result Value Ref Range   Total Protein 7.1 6.5 - 8.1 g/dL   Albumin 3.9 3.5 - 5.0 g/dL   AST 30 15 - 41 U/L   ALT 15 14 - 54 U/L   Alkaline Phosphatase 75 38 - 126 U/L   Total Bilirubin 0.8 0.3 - 1.2 mg/dL   Bilirubin, Direct 0.1 0.1 - 0.5 mg/dL   Indirect Bilirubin 0.7 0.3 - 0.9 mg/dL  Lipase, blood     Status: None   Collection Time: 01/27/16  5:26 PM  Result Value Ref Range   Lipase 24 11 - 51 U/L  Basic metabolic panel      Status: Abnormal   Collection Time: 01/28/16  3:13 AM  Result Value Ref Range   Sodium 137 135 - 145 mmol/L   Potassium 3.5 3.5 - 5.1 mmol/L   Chloride 104 101 - 111 mmol/L   CO2 28 22 - 32 mmol/L   Glucose, Bld 114 (H) 65 - 99 mg/dL   BUN 13 6 - 20 mg/dL   Creatinine, Ser 0.76 0.44 - 1.00 mg/dL   Calcium 8.4 (L) 8.9 - 10.3 mg/dL   GFR calc non Af Amer >60 >60 mL/min   GFR calc Af Amer >60 >60 mL/min    Comment: (NOTE) The eGFR has been calculated using the CKD EPI equation. This calculation has not been validated in all clinical situations. eGFR's persistently <60 mL/min signify possible Chronic Kidney Disease.    Anion gap 5 5 - 15  CBC     Status: None   Collection Time: 01/28/16  3:13 AM  Result Value Ref Range   WBC 6.8 3.6 - 11.0 K/uL   RBC 3.99 3.80 - 5.20 MIL/uL   Hemoglobin 12.9 12.0 - 16.0 g/dL   HCT 37.4 35.0 - 47.0 %   MCV 93.7 80.0 - 100.0 fL   MCH 32.2 26.0 - 34.0 pg   MCHC 34.4 32.0 - 36.0 g/dL   RDW 12.9 11.5 - 14.5 %   Platelets 184 150 - 440 K/uL  APTT     Status: None   Collection Time: 01/28/16  3:13 AM  Result Value Ref Range   aPTT 34 24 - 36 seconds  Protime-INR     Status: None   Collection Time: 01/28/16  3:13 AM  Result Value Ref Range   Prothrombin Time 13.9 11.4 - 15.2 seconds   INR 1.07   Heparin level (unfractionated)     Status: Abnormal   Collection Time: 01/28/16 11:40 AM  Result Value Ref Range   Heparin Unfractionated 0.93 (H) 0.30 - 0.70 IU/mL    Comment:        IF HEPARIN RESULTS ARE BELOW EXPECTED VALUES, AND PATIENT DOSAGE HAS BEEN CONFIRMED, SUGGEST FOLLOW UP TESTING OF ANTITHROMBIN III LEVELS.     Dg Chest 2 View  Result Date: 01/27/2016  CLINICAL DATA:  Nausea since last night, diarrhea today, shortness of breath, LEFT anterior chest pain EXAM: CHEST  2 VIEW COMPARISON:  12/28/2007 FINDINGS: Minimal enlargement of cardiac silhouette. Mediastinal contours and pulmonary vascularity normal. Mild subsegmental atelectasis  at LEFT lung base. Lungs clear. No pleural effusion or pneumothorax. Eventration of RIGHT diaphragm appears stable. Bones demineralized. IMPRESSION: Subsegmental atelectasis at LEFT lung base. Minimal enlargement of cardiac silhouette. Electronically Signed   By: Lavonia Dana M.D.   On: 01/27/2016 18:08   Ct Chest W Contrast  Result Date: 01/27/2016 CLINICAL DATA:  82 year old female with left lower chest left upper quadrant pain. EXAM: CT CHEST, ABDOMEN, AND PELVIS WITH CONTRAST TECHNIQUE: Multidetector CT imaging of the chest, abdomen and pelvis was performed following the standard protocol during bolus administration of intravenous contrast. CONTRAST:  190m ISOVUE-300 IOPAMIDOL (ISOVUE-300) INJECTION 61% COMPARISON:  Abdominal radiograph dated 08/17/2014 and CT dated 05/05/2014 FINDINGS: CT CHEST FINDINGS Cardiovascular: There is no cardiomegaly or pericardial effusion. There is mild atherosclerotic calcification of the thoracic aorta. No aneurysmal dilatation or evidence of dissection. There is atherosclerotic calcification with partial luminal occlusion of the visualized proximal left vertebral artery, likely chronic. Correlation with clinical exam recommended. CT angiography may provide better evaluation if clinically indicated. The remainder of the visualized portion of the origins of the great vessels of the aortic arch appear patent. The central pulmonary artery is are patent as well. Mediastinum/Nodes: There is no hilar or mediastinal adenopathy. The esophagus is grossly unremarkable. Lungs/Pleura: Left lung base linear atelectasis/ scarring. The lungs are otherwise clear. There is no pleural effusion or pneumothorax. The central airways are patent. Musculoskeletal: There is no axillary adenopathy. The chest wall soft tissues appear unremarkable. There is scoliosis with osteopenia and degenerative changes of the spine. No acute fracture. CT ABDOMEN PELVIS FINDINGS No intra-abdominal free air or free  fluid. Hepatobiliary: Cholecystectomy. There is mild dilatation of the common bile duct, likely post cholecystectomy. The liver is unremarkable. Subcentimeter left hepatic hypodense lesion is too small to characterize but likely represents a cyst or hemangioma. Pancreas: Unremarkable. No pancreatic ductal dilatation or surrounding inflammatory changes. Spleen: There is a 1.7 x 3.0 cm wedge-shaped area of subcapsular hypodensity in the posterior aspect of the inferior spleen most compatible with a focal splenic infarct. There is no hematoma, drainable fluid collection, or abscess. Adrenals/Urinary Tract: An 8 mm left adrenal nodule, indeterminate, likely an adenoma. The right adrenal gland appears unremarkable. Multiple left renal parapelvic cysts noted. There is no hydronephrosis on either side. There is mild bilateral renal atrophy. The visualized ureters and urinary bladder appear unremarkable. Stomach/Bowel: There is extensive sigmoid diverticulosis with muscular hypertrophy. No active inflammatory changes. There is no evidence of bowel obstruction or active inflammation. Normal appendix. Vascular/Lymphatic: There is moderate aortoiliac atherosclerotic disease. The origins of the celiac axis, SMA, IMA as well as the origins of the renal arteries appear patent. The SMV, splenic vein, and main portal vein are patent. No portal venous gas identified. There is no adenopathy. Reproductive: Hysterectomy. Other: None Musculoskeletal: Osteopenia with degenerative changes of the spine. No acute fracture. IMPRESSION: Small focal wedge-shaped splenic hypodensity most compatible with an infarct. Clinical correlation is recommended. No drainable fluid collection or abscess or hematoma. No other acute intrathoracic, abdominal, or pelvic pathology identified. Electronically Signed   By: AAnner CreteM.D.   On: 01/27/2016 22:54   Ct Abdomen Pelvis W Contrast  Result Date: 01/27/2016 CLINICAL DATA:  82year old female  with left lower chest left upper  quadrant pain. EXAM: CT CHEST, ABDOMEN, AND PELVIS WITH CONTRAST TECHNIQUE: Multidetector CT imaging of the chest, abdomen and pelvis was performed following the standard protocol during bolus administration of intravenous contrast. CONTRAST:  115m ISOVUE-300 IOPAMIDOL (ISOVUE-300) INJECTION 61% COMPARISON:  Abdominal radiograph dated 08/17/2014 and CT dated 05/05/2014 FINDINGS: CT CHEST FINDINGS Cardiovascular: There is no cardiomegaly or pericardial effusion. There is mild atherosclerotic calcification of the thoracic aorta. No aneurysmal dilatation or evidence of dissection. There is atherosclerotic calcification with partial luminal occlusion of the visualized proximal left vertebral artery, likely chronic. Correlation with clinical exam recommended. CT angiography may provide better evaluation if clinically indicated. The remainder of the visualized portion of the origins of the great vessels of the aortic arch appear patent. The central pulmonary artery is are patent as well. Mediastinum/Nodes: There is no hilar or mediastinal adenopathy. The esophagus is grossly unremarkable. Lungs/Pleura: Left lung base linear atelectasis/ scarring. The lungs are otherwise clear. There is no pleural effusion or pneumothorax. The central airways are patent. Musculoskeletal: There is no axillary adenopathy. The chest wall soft tissues appear unremarkable. There is scoliosis with osteopenia and degenerative changes of the spine. No acute fracture. CT ABDOMEN PELVIS FINDINGS No intra-abdominal free air or free fluid. Hepatobiliary: Cholecystectomy. There is mild dilatation of the common bile duct, likely post cholecystectomy. The liver is unremarkable. Subcentimeter left hepatic hypodense lesion is too small to characterize but likely represents a cyst or hemangioma. Pancreas: Unremarkable. No pancreatic ductal dilatation or surrounding inflammatory changes. Spleen: There is a 1.7 x 3.0 cm  wedge-shaped area of subcapsular hypodensity in the posterior aspect of the inferior spleen most compatible with a focal splenic infarct. There is no hematoma, drainable fluid collection, or abscess. Adrenals/Urinary Tract: An 8 mm left adrenal nodule, indeterminate, likely an adenoma. The right adrenal gland appears unremarkable. Multiple left renal parapelvic cysts noted. There is no hydronephrosis on either side. There is mild bilateral renal atrophy. The visualized ureters and urinary bladder appear unremarkable. Stomach/Bowel: There is extensive sigmoid diverticulosis with muscular hypertrophy. No active inflammatory changes. There is no evidence of bowel obstruction or active inflammation. Normal appendix. Vascular/Lymphatic: There is moderate aortoiliac atherosclerotic disease. The origins of the celiac axis, SMA, IMA as well as the origins of the renal arteries appear patent. The SMV, splenic vein, and main portal vein are patent. No portal venous gas identified. There is no adenopathy. Reproductive: Hysterectomy. Other: None Musculoskeletal: Osteopenia with degenerative changes of the spine. No acute fracture. IMPRESSION: Small focal wedge-shaped splenic hypodensity most compatible with an infarct. Clinical correlation is recommended. No drainable fluid collection or abscess or hematoma. No other acute intrathoracic, abdominal, or pelvic pathology identified. Electronically Signed   By: AAnner CreteM.D.   On: 01/27/2016 22:54    Review of Systems  Constitutional: Positive for malaise/fatigue.  HENT: Positive for congestion.   Eyes: Negative.   Respiratory: Positive for shortness of breath.   Cardiovascular: Positive for palpitations.  Gastrointestinal: Positive for abdominal pain.  Genitourinary: Negative.   Musculoskeletal: Negative.   Skin: Negative.   Neurological: Positive for weakness.  Endo/Heme/Allergies: Negative.   Psychiatric/Behavioral: Positive for memory loss.   Blood  pressure 128/72, pulse 90, temperature 98.7 F (37.1 C), temperature source Oral, resp. rate 18, height '5\' 2"'  (1.575 m), weight 72.6 kg (160 lb), SpO2 96 %. Physical Exam  Nursing note and vitals reviewed. Constitutional: She is oriented to person, place, and time. She appears well-developed and well-nourished. She appears lethargic.  HENT:  Head: Normocephalic  and atraumatic.  Eyes: Conjunctivae and EOM are normal. Pupils are equal, round, and reactive to light.  Neck: Normal range of motion. Neck supple.  Cardiovascular: Normal rate and normal pulses.  An irregularly irregular rhythm present.  Murmur heard.  Systolic murmur is present with a grade of 2/6  Respiratory: Effort normal and breath sounds normal.  GI: Soft. Bowel sounds are normal.  Musculoskeletal: Normal range of motion.  Neurological: She is oriented to person, place, and time. She has normal strength and normal reflexes. She appears lethargic. Coordination abnormal.  Skin: Skin is warm and dry.  Psychiatric: She has a normal mood and affect.    Assessment/Plan: Splenic infarct AFIB parox HX OF TIA HTN PVD GERD Hyperchol Hyperlipidemia Post polio syndrome Dementia . PLAN ROMI Continue tele and EKGs Recommend anticoug withEliquis Rate control for AFIB HTN control with cardiazem Agree with aricept for dementia Continue synthroid for thyroid disease Pain control for post polio syndrome F/U with cardiology as outpt 1-2 weeks    Appollonia Klee D Kazi Reppond 01/28/2016, 9:12 PM

## 2016-01-28 NOTE — Progress Notes (Signed)
ANTICOAGULATION CONSULT NOTE - Initial Consult  Pharmacy Consult for Heparin  Indication: atrial fibrillation  Allergies  Allergen Reactions  . [place] Grass Allergy Skin Test Itching  . Chocolate Flavor Other (See Comments)    Reaction: unknown  . Midazolam Other (See Comments)    Reaction: trouble waking up  . Pravastatin Other (See Comments)    WAS UNABLE TO WALK PER PT.  Marland Kitchen Propoxyphene Napsylate [Propoxyphene] Other (See Comments)    Darvocet-N 100 Reaction: unknown  . Reglan [Metoclopramide] Other (See Comments)    Reaction: felt like tongue was "drawing up"    Patient Measurements: Height: 5\' 2"  (157.5 cm) Weight: 160 lb (72.6 kg) IBW/kg (Calculated) : 50.1 Heparin Dosing Weight: 65.6 kg   Vital Signs: Temp: 97.8 F (36.6 C) (12/16 0142) Temp Source: Oral (12/16 0142) BP: 134/46 (12/16 0142) Pulse Rate: 96 (12/16 0142)  Labs:  Recent Labs  01/27/16 1726  HGB 14.1  HCT 40.7  PLT 216  CREATININE 0.77  TROPONINI <0.03    Estimated Creatinine Clearance: 50.6 mL/min (by C-G formula based on SCr of 0.77 mg/dL).   Medical History: Past Medical History:  Diagnosis Date  . Breast cancer ([place])   . Cervicodynia   . Chronic back pain   . Chronic cystitis   . Dysphagia   . Esophageal reflux   . HBP (high blood pressure)   . History of augmentation mammoplasty   . History of renal stone   . Hypothyroidism   . IBS (irritable bowel syndrome)   . Incontinence   . Interstitial cystitis   . Microscopic hematuria   . Polio     Medications:  Prescriptions Prior to Admission  Medication Sig Dispense Refill Last Dose  . dicyclomine (BENTYL) 10 MG capsule Take 10 mg by mouth 4 (four) times daily -  before meals and at bedtime.   01/27/2016 at Unknown time  . diltiazem (CARDIZEM CD) 120 MG 24 hr capsule Take 120 mg by mouth daily.   01/27/2016 at Unknown time  . donepezil (ARICEPT) 10 MG tablet Take 10 mg by mouth at bedtime.  5 01/27/2016 at Unknown time  .  hydrochlorothiazide (MICROZIDE) 12.5 MG capsule Take 12.5 mg by mouth daily.   01/27/2016 at Unknown time  . HYDROcodone-acetaminophen (NORCO) 10-325 MG per tablet Take 0.5-1 tablets by mouth every 6 (six) hours as needed for severe pain.    prn at prn  . hyoscyamine (LEVSIN, ANASPAZ) 0.125 MG tablet Take 0.125 mg by mouth every 4 (four) hours as needed for bladder spasms.    prn at prn  . levothyroxine (SYNTHROID, LEVOTHROID) 125 MCG tablet Take 125 mcg by mouth daily before breakfast.   01/27/2016 at Unknown time    Assessment: CrCl = 50.6 ml/min  Goal of Therapy:  Heparin level 0.3-0.7 units/ml Monitor platelets by anticoagulation protocol: Yes   Plan:  Give 3900 units bolus x 1 Start heparin infusion at 1000 units/hr Check anti-Xa level in 8 hours and daily while on heparin Continue to monitor H&H and platelets  Kimberly Nieland D 01/28/2016,3:12 AM

## 2016-01-30 NOTE — Discharge Summary (Signed)
[facility] NAME: Loretta Johns    MR#:  [id number]  DATE OF BIRTH:  1931-10-12  DATE OF ADMISSION:  01/27/2016 ADMITTING PHYSICIAN: Lance Coon, MD  DATE OF DISCHARGE: 01/28/2016  5:21 PM  PRIMARY CARE PHYSICIAN: ADAMS, Melba Coon, MD   ADMISSION DIAGNOSIS:  Splenic infarction [D73.5] LUQ abdominal pain [R10.12] Atrial fibrillation, unspecified type ([place]) [I48.91]  DISCHARGE DIAGNOSIS:  Principal Problem:   Splenic infarct Active Problems:   Acquired hypothyroidism   Essential (primary) hypertension   Acid reflux   AF (paroxysmal atrial fibrillation) ([place])   SECONDARY DIAGNOSIS:   Past Medical History:  Diagnosis Date  . Breast cancer ([place])   . Cervicodynia   . Chronic back pain   . Chronic cystitis   . Dysphagia   . Esophageal reflux   . HBP (high blood pressure)   . History of augmentation mammoplasty   . History of renal stone   . Hypothyroidism   . IBS (irritable bowel syndrome)   . Incontinence   . Interstitial cystitis   . Microscopic hematuria   . Polio      ADMITTING HISTORY  Loretta Johns  is a 82 y.o. female who presents with Shortness of breath and left upper quadrant abdominal pain. Evaluation here in the ED showed that the patient is likely in atrial fibrillation, and CT scan showed small splenic infarct. Patient states that she was diagnosed with A. fib at some point in the past when she was having a procedure done, and then shortly after that she was placed on anticoagulation, Xarelto and then Eliquis, but that medication became too expensive so she stopped taking it. Hospitals were called for admission  HOSPITAL COURSE:   * Splenic infarct Due to Afib and not taking eliquis. Discussed with Dr. Clayborn Bigness who saw the patient and was given prescription for eliquis. No thrombus on echo. Pain improved. Mild nausea without vomiting.  Discharged home in stable condition  CONSULTS OBTAINED:   Treatment Team:  Yolonda Kida, MD  DRUG ALLERGIES:   Allergies  Allergen Reactions  . [place] Grass Allergy Skin Test Itching  . Chocolate Flavor Other (See Comments)    Reaction: unknown  . Midazolam Other (See Comments)    Reaction: trouble waking up  . Pravastatin Other (See Comments)    WAS UNABLE TO WALK PER PT.  Marland Kitchen Propoxyphene Napsylate [Propoxyphene] Other (See Comments)    Darvocet-N 100 Reaction: unknown  . Reglan [Metoclopramide] Other (See Comments)    Reaction: felt like tongue was "drawing up"    DISCHARGE MEDICATIONS:   Discharge Medication List as of 01/28/2016  5:08 PM    START taking these medications   Details  apixaban (ELIQUIS) 5 MG TABS tablet Take 1 tablet (5 mg total) by mouth 2 (two) times daily., Starting Sat 01/28/2016, Print    ondansetron (ZOFRAN) 4 MG tablet Take 1 tablet (4 mg total) by mouth every 6 (six) hours as needed for nausea., Starting Sat 01/28/2016, Print      CONTINUE these medications which have CHANGED   Details  dicyclomine (BENTYL) 10 MG capsule Take 1 capsule (10 mg total) by mouth 4 (four) times daily -  before meals and at bedtime., Starting Sat 01/28/2016, Normal    HYDROcodone-acetaminophen (NORCO) 10-325 MG tablet Take 0.5-1 tablets by mouth every 6 (six) hours as needed for severe pain., Starting Sat 01/28/2016, Print      CONTINUE these medications which have NOT CHANGED  Details  diltiazem (CARDIZEM CD) 120 MG 24 hr capsule Take 120 mg by mouth daily., Starting Mon 01/09/2016, Historical Med    donepezil (ARICEPT) 10 MG tablet Take 10 mg by mouth at bedtime., Starting Fri 12/30/2015, Historical Med    hydrochlorothiazide (MICROZIDE) 12.5 MG capsule Take 12.5 mg by mouth daily., Historical Med    hyoscyamine (LEVSIN, ANASPAZ) 0.125 MG tablet Take 0.125 mg by mouth every 4 (four) hours as needed for bladder spasms. , Starting Thu 03/17/2015, Historical Med    levothyroxine (SYNTHROID, LEVOTHROID) 125 MCG tablet  Take 125 mcg by mouth daily before breakfast., Historical Med      STOP taking these medications     mirabegron ER (MYRBETRIQ) 25 MG TB24 tablet      ranitidine (ZANTAC) 150 MG capsule         Today   VITAL SIGNS:  Blood pressure 128/72, pulse 90, temperature 98.7 F (37.1 C), temperature source Oral, resp. rate 18, height 5\' 2"  (1.575 m), weight 72.6 kg (160 lb), SpO2 96 %.  I/O:  No intake or output data in the 24 hours ending 01/30/16 1444  PHYSICAL EXAMINATION:  Physical Exam  GENERAL:  82 y.o.-year-old patient lying in the bed with no acute distress.  LUNGS: Normal breath sounds bilaterally, no wheezing, rales,rhonchi or crepitation. No use of accessory muscles of respiration.  CARDIOVASCULAR: S1, S2 normal. No murmurs, rubs, or gallops.  ABDOMEN: Soft, non-tender, non-distended. Bowel sounds present. No organomegaly or mass.  NEUROLOGIC: Moves all 4 extremities. PSYCHIATRIC: The patient is alert and oriented x 3.  SKIN: No obvious rash, lesion, or ulcer.   DATA REVIEW:   CBC  Recent Labs Lab 01/28/16 0313  WBC 6.8  HGB 12.9  HCT 37.4  PLT 184    Chemistries   Recent Labs Lab 01/27/16 1726 01/28/16 0313  NA 135 137  K 3.5 3.5  CL 104 104  CO2 24 28  GLUCOSE 113* 114*  BUN 14 13  CREATININE 0.77 0.76  CALCIUM 9.0 8.4*  AST 30  --   ALT 15  --   ALKPHOS 75  --   BILITOT 0.8  --     Cardiac Enzymes  Recent Labs Lab 01/27/16 1726  TROPONINI <0.03    Microbiology Results  Results for orders placed or performed in visit on 10/14/14  Microscopic Examination     Status: None   Collection Time: 10/14/14  3:55 PM  Result Value Ref Range Status   WBC, UA 0-5 0 - 5 /hpf Final   RBC, UA 0-2 0 - 2 /hpf Final   Epithelial Cells (non renal) 0-10 0 - 10 /hpf Final   Bacteria, UA None seen None seen/Few Final  CULTURE, URINE COMPREHENSIVE     Status: None   Collection Time: 10/14/14  3:57 PM  Result Value Ref Range Status   Urine Culture,  Comprehensive Final report  Final   Result 1 Comment  Final    Comment: Mixed urogenital flora 10,000-25,000 colony forming units per mL     RADIOLOGY:  No results found.  Follow up with PCP in 1 week.  Management plans discussed with the patient, family and they are in agreement.  CODE STATUS:  Code Status History    Date Active Date Inactive Code Status Order ID Comments User Context   01/28/2016  1:36 AM 01/28/2016  8:21 PM Full Code [id number]  Lance Coon, MD Inpatient    Advance Directive Documentation   Flowsheet Row Most Recent  Value  Type of Advance Directive  Healthcare Power of Attorney, Living will  Pre-existing out of facility DNR order (yellow form or pink MOST form)  No data  "MOST" Form in Place?  No data      TOTAL TIME TAKING CARE OF THIS PATIENT ON DAY OF DISCHARGE: more than 30 minutes.   Hillary Bow R M.D on 01/30/2016 at 2:44 PM  Between 7am to 6pm - Pager - [phone]  After 6pm go to [email or web address] - password [email or web address] [facility]  Office  [phone]  CC: Primary care physician; ADAMS, Melba Coon, MD  Note: This dictation was prepared with Dragon dictation along with smaller phrase technology. Any transcriptional errors that result from this process are unintentional.

## 2016-05-30 ENCOUNTER — Other Ambulatory Visit: Payer: Self-pay | Admitting: Nurse Practitioner

## 2016-05-30 DIAGNOSIS — R413 Other amnesia: Secondary | ICD-10-CM

## 2016-05-30 DIAGNOSIS — Z8673 Personal history of transient ischemic attack (TIA), and cerebral infarction without residual deficits: Secondary | ICD-10-CM

## 2016-06-05 ENCOUNTER — Emergency Department: Payer: Medicare PPO

## 2016-06-05 ENCOUNTER — Emergency Department
Admission: EM | Admit: 2016-06-05 | Discharge: 2016-06-05 | Disposition: A | Discharge status: Home / Self Care | Payer: Medicare PPO | Attending: Emergency Medicine | Admitting: Emergency Medicine

## 2016-06-05 ENCOUNTER — Encounter: Payer: Self-pay | Admitting: Emergency Medicine

## 2016-06-05 DIAGNOSIS — F0151 Vascular dementia with behavioral disturbance: Secondary | ICD-10-CM

## 2016-06-05 DIAGNOSIS — R791 Abnormal coagulation profile: Secondary | ICD-10-CM | POA: Diagnosis not present

## 2016-06-05 DIAGNOSIS — R51 Headache: Secondary | ICD-10-CM | POA: Diagnosis present

## 2016-06-05 DIAGNOSIS — I10 Essential (primary) hypertension: Secondary | ICD-10-CM | POA: Diagnosis not present

## 2016-06-05 DIAGNOSIS — F0391 Unspecified dementia with behavioral disturbance: Secondary | ICD-10-CM

## 2016-06-05 DIAGNOSIS — F01518 Vascular dementia, unspecified severity, with other behavioral disturbance: Secondary | ICD-10-CM

## 2016-06-05 DIAGNOSIS — Z79899 Other long term (current) drug therapy: Secondary | ICD-10-CM | POA: Diagnosis not present

## 2016-06-05 LAB — CBC WITH DIFFERENTIAL/PLATELET
BASOS PCT: 1 %
Basophils Absolute: 0.1 10*3/uL (ref 0–0.1)
EOS ABS: 0.2 10*3/uL (ref 0–0.7)
EOS PCT: 3 %
HCT: 40.9 % (ref 35.0–47.0)
HEMOGLOBIN: 14 g/dL (ref 12.0–16.0)
LYMPHS ABS: 1.6 10*3/uL (ref 1.0–3.6)
Lymphocytes Relative: 22 %
MCH: 30.4 pg (ref 26.0–34.0)
MCHC: 34.3 g/dL (ref 32.0–36.0)
MCV: 88.6 fL (ref 80.0–100.0)
MONO ABS: 0.6 10*3/uL (ref 0.2–0.9)
MONOS PCT: 8 %
Neutro Abs: 5 10*3/uL (ref 1.4–6.5)
Neutrophils Relative %: 66 %
Platelets: 283 10*3/uL (ref 150–440)
RBC: 4.61 MIL/uL (ref 3.80–5.20)
RDW: 13.2 % (ref 11.5–14.5)
WBC: 7.4 10*3/uL (ref 3.6–11.0)

## 2016-06-05 LAB — BASIC METABOLIC PANEL
Anion gap: 10 (ref 5–15)
BUN: 12 mg/dL (ref 6–20)
CALCIUM: 9.2 mg/dL (ref 8.9–10.3)
CHLORIDE: 101 mmol/L (ref 101–111)
CO2: 27 mmol/L (ref 22–32)
CREATININE: 0.9 mg/dL (ref 0.44–1.00)
GFR calc non Af Amer: 58 mL/min — ABNORMAL LOW (ref >60)
Glucose, Bld: 128 mg/dL — ABNORMAL HIGH (ref 65–99)
Potassium: 3.2 mmol/L — ABNORMAL LOW (ref 3.5–5.1)
SODIUM: 138 mmol/L (ref 135–145)

## 2016-06-05 LAB — HEPATIC FUNCTION PANEL
ALBUMIN: 3.8 g/dL (ref 3.5–5.0)
ALK PHOS: 100 U/L (ref 38–126)
ALT: 21 U/L (ref 14–54)
AST: 40 U/L (ref 15–41)
Bilirubin, Direct: 0.3 mg/dL (ref 0.1–0.5)
Indirect Bilirubin: 1 mg/dL — ABNORMAL HIGH (ref 0.3–0.9)
TOTAL PROTEIN: 7.6 g/dL (ref 6.5–8.1)
Total Bilirubin: 1.3 mg/dL — ABNORMAL HIGH (ref 0.3–1.2)

## 2016-06-05 LAB — PROTIME-INR
INR: 8.5
Prothrombin Time: 73.1 seconds — ABNORMAL HIGH (ref 11.4–15.2)

## 2016-06-05 LAB — TSH: TSH: 0.53 u[IU]/mL (ref 0.350–4.500)

## 2016-06-05 MED ORDER — PHYTONADIONE 5 MG PO TABS
2.5000 mg | ORAL_TABLET | Freq: Once | ORAL | Status: AC
  Administered 2016-06-05: 2.5 mg via ORAL
  Filled 2016-06-05: qty 1

## 2016-06-05 MED ORDER — RISPERIDONE 0.25 MG PO TABS: 0.2500 mg | ORAL_TABLET | Freq: Two times a day (BID) | ORAL | 0 refills | Status: AC | PRN

## 2016-06-05 NOTE — Consult Note (Signed)
[facility] Consult   Reason for Consult:  Consult for 83 year old woman currently in the hospital with concerns about her anticoagulation status. Concern was raised about hallucinations and dementia Referring Physician:  Cinda Quest Patient Identification: Loretta Johns MRN:  [id number] Principal Diagnosis: Dementia, vascular, mixed, with behavioral disturbance Diagnosis:   Patient Active Problem List   Diagnosis Date Noted  . Dementia, vascular, mixed, with behavioral disturbance [F01.51] 06/05/2016  . Splenic infarct [D73.5] 01/27/2016  . Acquired hypothyroidism [E03.9] 08/17/2014  . Essential (primary) hypertension [I10] 08/17/2014  . Suprapubic pain [R10.2] 08/17/2014  . History of renal stone [Z87.442] 08/17/2014  . Blood glucose elevated [R73.9] 07/24/2014  . Drug intolerance [Z78.9] 07/23/2014  . Atrial fibrillation with rapid ventricular response ([place]) [I48.91] 07/31/2013  . AF (paroxysmal atrial fibrillation) ([place]) [I48.0] 07/31/2013  . Acid reflux [K21.9] 07/21/2013  . Carotid artery narrowing [I65.29] 06/18/2013  . H/O transient cerebral ischemia [Z86.73] 06/18/2013  . Accumulation of fluid in tissues [R60.9] 08/12/2012  . Hypercholesterolemia without hypertriglyceridemia [E78.00] 07/21/2012  . Cannot sleep [G47.00] 04/18/2011  . Post poliomyelitis syndrome [G14] 04/18/2011    Total Time spent with patient: 1 hour  Subjective:   Loretta Johns is a 83 y.o. female patient admitted with "sometimes it bothers me".  HPI:  Patient interviewed. Chart reviewed. Case discussed with emergency room physician. History also obtained from the patient's daughter. 82 year old woman who is actually here because of concerns about her anticoagulation status but daughter brought up concerns with the emergency room doctor about hallucinations and dementia. Apparently the patient has been having more spells of waking up at night confused, feeling like people were  in the house, sometimes seeing things or feeling like people are sneaking up on her. Her nerves of gotten worse and she has been more disturbed by it. On interview today the patient did admit to me that she sometimes sees things although she had a hard time articulating exactly what it was. She admitted that she gets confused sometimes. She is having some trouble sleeping especially falling asleep at night. Denies feeling depressed denies feeling angry or other acute emotional symptoms. Patient denies auditory hallucinations. Totally denies suicidal or homicidal ideation. She endorses having positive things in her life that she looks forward to and a generally good quality of life. Patient's memory is clearly impaired daughter provides a little more history saying that it is becoming more and more frequent that the patient is disturbed and upset and irritable at night because of confusion.  Social history: Patient lives with her daughter. Nobody else at home. Patient indicates she has a good relationship with her whole extended family. She is a retired Nurse, children's.  Medical history: CT shows an old left frontal stroke. History of atrial fibrillation with resultant need for anticoagulation hypothyroidism  Substance abuse history: Denies any current or past substance abuse issues  Past Psychiatric History: Patient has no past psychiatric history. No history of hospitalization no history of depression. No history of suicidal or aggressive behavior no history of psychiatric medicine except that recently her primary care doctor started her on a very low dose of Seroquel for exactly this problem.  Risk to Self: Is patient at risk for suicide?: No Risk to Others:   Prior Inpatient Therapy:   Prior Outpatient Therapy:    Past Medical History:  Past Medical History:  Diagnosis Date  . Breast cancer ([place])   . Cervicodynia   . Chronic back pain   . Chronic cystitis   .  Dysphagia   . Esophageal  reflux   . HBP (high blood pressure)   . History of augmentation mammoplasty   . History of renal stone   . Hypothyroidism   . IBS (irritable bowel syndrome)   . Incontinence   . Interstitial cystitis   . Microscopic hematuria   . Polio     Past Surgical History:  Procedure Laterality Date  . ABDOMINAL HYSTERECTOMY  1980  . BREAST ENHANCEMENT SURGERY    . CHOLECYSTECTOMY  1996  . CYSTOSCOPY  09/25/2009   with hydrodilation and bladder biopsy and fulgeration of trigone  . KNEE SURGERY Left   . Urethral polyps  07/01/1991   Family History:  Family History  Problem Relation Age of Onset  . CAD Mother   . Cancer Father   . Kidney disease Brother     Reanl cancer  . Prostate cancer Neg Hx    Family Psychiatric  History: Has a son who has some mental health issues and takes a modest dose of risperidone himself. Social History:  History  Alcohol Use No     History  Drug Use No    Social History   Social History  . Marital status: Widowed    Spouse name: N/A  . Number of children: N/A  . Years of education: N/A   Social History Main Topics  . Smoking status: Never Smoker  . Smokeless tobacco: Never Used  . Alcohol use No  . Drug use: No  . Sexual activity: Not Asked   Other Topics Concern  . None   Social History Narrative  . None   Additional Social History:    Allergies:   Allergies  Allergen Reactions  . [place] Grass Allergy Skin Test Itching  . Chocolate Flavor Other (See Comments)    Reaction: unknown  . Midazolam Other (See Comments)    Reaction: trouble waking up  . Pravastatin Other (See Comments)    WAS UNABLE TO WALK PER PT.  Marland Kitchen Propoxyphene Napsylate [Propoxyphene] Other (See Comments)    Darvocet-N 100 Reaction: unknown  . Reglan [Metoclopramide] Other (See Comments)    Reaction: felt like tongue was "drawing up"    Labs:  Results for orders placed or performed during the hospital encounter of 06/05/16 (from the past 48 hour(s))    Protime-INR     Status: Abnormal   Collection Time: 06/05/16  5:07 PM  Result Value Ref Range   Prothrombin Time 73.1 (H) 11.4 - 15.2 seconds   INR 8.50 (HH)     Comment: CRITICAL RESULT CALLED TO, READ BACK BY AND VERIFIED WITH: KATE BUMGARNER 06/05/16 @ [facility]   CBC with Differential     Status: None   Collection Time: 06/05/16  5:07 PM  Result Value Ref Range   WBC 7.4 3.6 - 11.0 K/uL   RBC 4.61 3.80 - 5.20 MIL/uL   Hemoglobin 14.0 12.0 - 16.0 g/dL   HCT 40.9 35.0 - 47.0 %   MCV 88.6 80.0 - 100.0 fL   MCH 30.4 26.0 - 34.0 pg   MCHC 34.3 32.0 - 36.0 g/dL   RDW 13.2 11.5 - 14.5 %   Platelets 283 150 - 440 K/uL   Neutrophils Relative % 66 %   Neutro Abs 5.0 1.4 - 6.5 K/uL   Lymphocytes Relative 22 %   Lymphs Abs 1.6 1.0 - 3.6 K/uL   Monocytes Relative 8 %   Monocytes Absolute 0.6 0.2 - 0.9 K/uL   Eosinophils Relative  3 %   Eosinophils Absolute 0.2 0 - 0.7 K/uL   Basophils Relative 1 %   Basophils Absolute 0.1 0 - 0.1 K/uL  Basic metabolic panel     Status: Abnormal   Collection Time: 06/05/16  5:07 PM  Result Value Ref Range   Sodium 138 135 - 145 mmol/L   Potassium 3.2 (L) 3.5 - 5.1 mmol/L    Comment: HEMOLYSIS AT THIS LEVEL MAY AFFECT RESULT   Chloride 101 101 - 111 mmol/L   CO2 27 22 - 32 mmol/L   Glucose, Bld 128 (H) 65 - 99 mg/dL   BUN 12 6 - 20 mg/dL   Creatinine, Ser 0.90 0.44 - 1.00 mg/dL   Calcium 9.2 8.9 - 10.3 mg/dL   GFR calc non Af Amer 58 (L) >60 mL/min   GFR calc Af Amer >60 >60 mL/min    Comment: (NOTE) The eGFR has been calculated using the CKD EPI equation. This calculation has not been validated in all clinical situations. eGFR's persistently <60 mL/min signify possible Chronic Kidney Disease.    Anion gap 10 5 - 15  Hepatic function panel     Status: Abnormal   Collection Time: 06/05/16  5:07 PM  Result Value Ref Range   Total Protein 7.6 6.5 - 8.1 g/dL   Albumin 3.8 3.5 - 5.0 g/dL   AST 40 15 - 41 U/L    Comment: HEMOLYSIS AT THIS  LEVEL MAY AFFECT RESULT   ALT 21 14 - 54 U/L   Alkaline Phosphatase 100 38 - 126 U/L   Total Bilirubin 1.3 (H) 0.3 - 1.2 mg/dL    Comment: HEMOLYSIS AT THIS LEVEL MAY AFFECT RESULT   Bilirubin, Direct 0.3 0.1 - 0.5 mg/dL    Comment: HEMOLYSIS AT THIS LEVEL MAY AFFECT RESULT   Indirect Bilirubin 1.0 (H) 0.3 - 0.9 mg/dL    Current Facility-Administered Medications  Medication Dose Route Frequency Provider Last Rate Last Dose  . phytonadione (VITAMIN K) tablet 2.5 mg  2.5 mg Oral Once Nena Polio, MD       Current Outpatient Prescriptions  Medication Sig Dispense Refill  . apixaban (ELIQUIS) 5 MG TABS tablet Take 1 tablet (5 mg total) by mouth 2 (two) times daily. 60 tablet 0  . dicyclomine (BENTYL) 10 MG capsule Take 1 capsule (10 mg total) by mouth 4 (four) times daily -  before meals and at bedtime. 60 capsule 0  . diltiazem (CARDIZEM CD) 120 MG 24 hr capsule Take 120 mg by mouth daily.    Marland Kitchen donepezil (ARICEPT) 10 MG tablet Take 10 mg by mouth at bedtime.  5  . hydrochlorothiazide (MICROZIDE) 12.5 MG capsule Take 12.5 mg by mouth daily.    Marland Kitchen HYDROcodone-acetaminophen (NORCO) 10-325 MG tablet Take 0.5-1 tablets by mouth every 6 (six) hours as needed for severe pain. 15 tablet 0  . hyoscyamine (LEVSIN, ANASPAZ) 0.125 MG tablet Take 0.125 mg by mouth every 4 (four) hours as needed for bladder spasms.     Marland Kitchen levothyroxine (SYNTHROID, LEVOTHROID) 125 MCG tablet Take 125 mcg by mouth daily before breakfast.    . ondansetron (ZOFRAN) 4 MG tablet Take 1 tablet (4 mg total) by mouth every 6 (six) hours as needed for nausea. 20 tablet 0  . risperiDONE (RISPERDAL) 0.25 MG tablet Take 1 tablet (0.25 mg total) by mouth 2 (two) times daily as needed (confusion or hallucinations). 60 tablet 0    Musculoskeletal: Strength & Muscle Tone: decreased Gait & Station: unable  to stand Patient leans: N/A  Psychiatric Specialty Exam: Physical Exam  Nursing note and vitals reviewed. Constitutional: She  appears well-developed and well-nourished.  HENT:  Head: Normocephalic and atraumatic.  Eyes: Conjunctivae are normal. Pupils are equal, round, and reactive to light.  Neck: Normal range of motion.  Cardiovascular: Normal heart sounds.   Respiratory: Effort normal.  GI: Soft.  Musculoskeletal: Normal range of motion.       Legs: Neurological: She is alert.  Skin: Skin is warm and dry.  Psychiatric: She has a normal mood and affect. Judgment normal. Her speech is delayed. She is slowed. Thought content is not paranoid. Cognition and memory are impaired. She expresses no homicidal and no suicidal ideation. She exhibits abnormal recent memory.    Review of Systems  Constitutional: Negative.   HENT: Negative.   Eyes: Negative.   Respiratory: Negative.   Cardiovascular: Negative.   Gastrointestinal: Negative.   Musculoskeletal: Negative.   Skin: Negative.   Neurological: Negative.   Psychiatric/Behavioral: Positive for hallucinations and memory loss. Negative for depression, substance abuse and suicidal ideas. The patient is nervous/anxious and has insomnia.     Blood pressure 109/80, pulse 74, temperature 98.6 F (37 C), temperature source Oral, resp. rate (!) 25, height _0  (1.575 m), weight 70.8 kg (156 lb), SpO2 96 %.Body mass index is 28.53 kg/m.  General Appearance: Fairly Groomed  Eye Contact:  Good  Speech:  Slow  Volume:  Decreased  Mood:  Euthymic  Affect:  Constricted  Thought Process:  Disorganized  Orientation:  Other:  Patient knew where she was quite precisely but was very confused about the date and time and was confused about why she was here in the hospital.  Thought Content:  Rumination and Tangential  Suicidal Thoughts:  No  Homicidal Thoughts:  No  Memory:  Immediate;   Fair Recent;   Poor Remote;   Fair  Judgement:  Fair  Insight:  Fair  Psychomotor Activity:  Decreased  Concentration:  Concentration: Fair  Recall:  [employer] of Knowledge:  Fair   Language:  Fair  Akathisia:  No  Handed:  Right  AIMS (if indicated):     Assets:  Communication Skills Desire for Improvement Financial Resources/Insurance Housing Resilience Social Support  ADL's:  Impaired  Cognition:  Impaired,  Mild  Sleep:        Treatment Plan Summary: Medication management and Plan 83 year old woman who to my examination gets a 20 out of 30 on her Mini-Mental status exam suggesting mild to moderate dementia. She is admittedly having hallucinations and confusion probably related to her dementia and also to a history of a left-sided stroke and possibly other vascular events. Patient admits with her daughter's help that she is disturbed by these things. The Seroquel recently started has not made any difference. I suggest adding a very small dose of Risperdal 0.25 mg to be taken at nighttime regularly and during the day if needed for confusion and hallucinations. Rationale explained to the patient. Case reviewed with emergency room doctor. Prescription is in place. Patient does not require psychiatric hospitalization. She can follow up with her primary care doctor.  Disposition: Patient does not meet criteria for psychiatric inpatient admission. Supportive therapy provided about ongoing stressors.  Alethia Berthold, MD 06/05/2016 7:33 PM

## 2016-06-05 NOTE — ED Notes (Signed)
Pt had blood work done today and was told INR was too high. Sent over here. Family states pt takes lasix and is worried about potassium as well, unsure if that is off. Pt alert, oriented. Pt sitting on bed, hooked up to cardiac monitor. c/o HA. Frontal HA, no vision changes. No nausea.

## 2016-06-05 NOTE — ED Notes (Signed)
Pt taken to CT via stretcher.

## 2016-06-05 NOTE — ED Notes (Signed)
Dr.Clapacs at bedside  

## 2016-06-05 NOTE — ED Triage Notes (Signed)
Patient present to ED via POV from PCP office. PCP office called and told patient to come to ED because her INR that was checked today is 10. Patient is on coumadin. Patient denies any complaints at this time.

## 2016-06-05 NOTE — ED Notes (Signed)
Called pharmacy to get vitamin K sent to ED

## 2016-06-05 NOTE — Discharge Instructions (Signed)
Please follow up with your doctor tomorrow for a repeat INR tomorrow. Please take the medicines Dr Weber Cooks has prescribed. Return for any bleeding th at does not stop with in a few minutes.

## 2016-06-05 NOTE — ED Notes (Signed)
Date and time results received: 06/05/16 5:45pm   Test: INR Critical Value: 8.5  Name of Provider Notified: Dr. Cinda Quest  Orders Received? Or Actions Taken?: CT Head ordered. Talked about administering Vitamin K once results are back.

## 2016-06-05 NOTE — ED Provider Notes (Signed)
[facility] Emergency Department Provider Note   ____________________________________________   First MD Initiated Contact with Patient 06/05/16 1729     (approximate)  I have reviewed the triage vital signs and the nursing notes.   HISTORY  Chief Complaint Abnormal Lab    HPI Loretta Johns is a 83 y.o. female sent for inr of 10. Here inr 8.5. Pt w/ slight nose bleed earlier, stopped by itself. No other bleeding. Mild headache. Frontal. Achy in nature. No chest/abd back or other pain. Family reports hx dementia and having visual hallucinations at night. Getting worse for weeks.   Past Medical History:  Diagnosis Date  . Breast cancer ([place])   . Cervicodynia   . Chronic back pain   . Chronic cystitis   . Dysphagia   . Esophageal reflux   . HBP (high blood pressure)   . History of augmentation mammoplasty   . History of renal stone   . Hypothyroidism   . IBS (irritable bowel syndrome)   . Incontinence   . Interstitial cystitis   . Microscopic hematuria   . Polio     Patient Active Problem List   Diagnosis Date Noted  . Splenic infarct 01/27/2016  . Acquired hypothyroidism 08/17/2014  . Essential (primary) hypertension 08/17/2014  . Suprapubic pain 08/17/2014  . History of renal stone 08/17/2014  . Blood glucose elevated 07/24/2014  . Drug intolerance 07/23/2014  . Atrial fibrillation with rapid ventricular response ([place]) 07/31/2013  . AF (paroxysmal atrial fibrillation) ([place]) 07/31/2013  . Acid reflux 07/21/2013  . Carotid artery narrowing 06/18/2013  . H/O transient cerebral ischemia 06/18/2013  . Accumulation of fluid in tissues 08/12/2012  . Hypercholesterolemia without hypertriglyceridemia 07/21/2012  . Cannot sleep 04/18/2011  . Post poliomyelitis syndrome 04/18/2011    Past Surgical History:  Procedure Laterality Date  . ABDOMINAL HYSTERECTOMY  1980  . BREAST ENHANCEMENT SURGERY    . CHOLECYSTECTOMY  1996  .  CYSTOSCOPY  09/25/2009   with hydrodilation and bladder biopsy and fulgeration of trigone  . KNEE SURGERY Left   . Urethral polyps  07/01/1991    Prior to Admission medications   Medication Sig Start Date End Date Taking? Authorizing Provider  apixaban (ELIQUIS) 5 MG TABS tablet Take 1 tablet (5 mg total) by mouth 2 (two) times daily. 01/28/16   Hillary Bow, MD  dicyclomine (BENTYL) 10 MG capsule Take 1 capsule (10 mg total) by mouth 4 (four) times daily -  before meals and at bedtime. 01/28/16   Srikar Sudini, MD  diltiazem (CARDIZEM CD) 120 MG 24 hr capsule Take 120 mg by mouth daily. 01/09/16   Historical Provider, MD  donepezil (ARICEPT) 10 MG tablet Take 10 mg by mouth at bedtime. 12/30/15   Historical Provider, MD  hydrochlorothiazide (MICROZIDE) 12.5 MG capsule Take 12.5 mg by mouth daily.    Historical Provider, MD  HYDROcodone-acetaminophen (NORCO) 10-325 MG tablet Take 0.5-1 tablets by mouth every 6 (six) hours as needed for severe pain. 01/28/16   Srikar Sudini, MD  hyoscyamine (LEVSIN, ANASPAZ) 0.125 MG tablet Take 0.125 mg by mouth every 4 (four) hours as needed for bladder spasms.  03/17/15   Historical Provider, MD  levothyroxine (SYNTHROID, LEVOTHROID) 125 MCG tablet Take 125 mcg by mouth daily before breakfast.    Historical Provider, MD  ondansetron (ZOFRAN) 4 MG tablet Take 1 tablet (4 mg total) by mouth every 6 (six) hours as needed for nausea. 01/28/16   Hillary Bow, MD  risperiDONE (RISPERDAL)  0.25 MG tablet Take 1 tablet (0.25 mg total) by mouth 2 (two) times daily as needed (confusion or hallucinations). 06/05/16   Gonzella Lex, MD    Allergies [place] grass allergy skin test; Chocolate flavor; Midazolam; Pravastatin; Propoxyphene napsylate [propoxyphene]; and Reglan [metoclopramide]  Family History  Problem Relation Age of Onset  . CAD Mother   . Cancer Father   . Kidney disease Brother     Reanl cancer  . Prostate cancer Neg Hx     Social History Social  History  Substance Use Topics  . Smoking status: Never Smoker  . Smokeless tobacco: Never Used  . Alcohol use No    Review of Systems  Constitutional: No fever/chills Eyes: No visual changes. ENT: No sore throat. Cardiovascular: Denies chest pain. Respiratory: Denies shortness of breath. Gastrointestinal: No abdominal pain.  No nausea, no vomiting.  No diarrhea.  No constipation. Genitourinary: Negative for dysuria. Musculoskeletal: Negative for back pain. Skin: Negative for rash. Neurological: Negative for  focal weakness or numbness.   ____________________________________________   PHYSICAL EXAM:  VITAL SIGNS: ED Triage Vitals [06/05/16 1657]  Enc Vitals Group     BP 130/73     Pulse Rate 99     Resp 17     Temp 98.6 F (37 C)     Temp Source Oral     SpO2 95 %     Weight 156 lb (70.8 kg)     Height 5\' 2"  (1.575 m)     Head Circumference      Peak Flow      Pain Score      Pain Loc      Pain Edu?      Excl. in [place]?     Constitutional: Alert and oriented. Well appearing and in no acute distress. Eyes: Conjunctivae are normal. PERRL. EOMI. Head: Atraumatic. Nose: No congestion/rhinnorhea.small amt dried blood in nares Mouth/Throat: Mucous membranes are moist.  Oropharynx non-erythematous. Neck: No stridor Cardiovascular: Normal rate, regular rhythm. Grossly normal heart sounds.  Good peripheral circulation. Respiratory: Normal respiratory effort.  No retractions. Lungs CTAB. Gastrointestinal: Soft and nontender. No distention. No abdominal bruits. No CVA tenderness. {Musculoskeletal: No lower extremity tenderness nor edema.  No joint effusions. Neurologic:  Normal speech and language. No gross focal neurologic deficits are appreciated.  Skin:  Skin is warm, dry and intact. No rash noted. Psychiatric: Mood and affect are normal. Speech and behavior are normal.  ____________________________________________   LABS (all labs ordered are listed, but only  abnormal results are displayed)  Labs Reviewed  PROTIME-INR - Abnormal; Notable for the following:       Result Value   Prothrombin Time 73.1 (*)    INR 8.50 (*)    All other components within normal limits  BASIC METABOLIC PANEL - Abnormal; Notable for the following:    Potassium 3.2 (*)    Glucose, Bld 128 (*)    GFR calc non Af Amer 58 (*)    All other components within normal limits  HEPATIC FUNCTION PANEL - Abnormal; Notable for the following:    Total Bilirubin 1.3 (*)    Indirect Bilirubin 1.0 (*)    All other components within normal limits  CBC WITH DIFFERENTIAL/PLATELET  TSH   ____________________________________________  EKG   ____________________________________________  RADIOLOGY  Study Result   CLINICAL DATA:  Elevated INR. Frontal headache. History of breast cancer, hypertension, hypercholesterolemia.  EXAM: CT HEAD WITHOUT CONTRAST  TECHNIQUE: Contiguous axial images were obtained from the  base of the skull through the vertex without intravenous contrast.  COMPARISON:  CT HEAD April 02, 2015  FINDINGS: BRAIN: No intraparenchymal hemorrhage, mass effect nor midline shift. Stable moderate to severe ventriculomegaly on the basis of global parenchymal brain volume loss. Old small LEFT frontal lobe infarct. Patchy supratentorial white matter hypodensities within normal range for patient's age, though non-specific are most compatible with chronic small vessel ischemic disease. No acute large vascular territory infarcts. No abnormal extra-axial fluid collections. Basal cisterns are patent.  VASCULAR: Mild calcific atherosclerosis of the carotid siphons.  SKULL: No skull fracture. No significant scalp soft tissue swelling.  SINUSES/ORBITS: The mastoid air-cells and included paranasal sinuses are well-aerated.The included ocular globes and orbital contents are non-suspicious.  OTHER: None.  IMPRESSION: No acute intracranial  process.  Stable moderate to severe parenchymal brain volume loss and moderate chronic small vessel ischemic disease. Old small LEFT frontal lobe infarct.   Electronically Signed   By: Elon Alas M.D.   On: 06/05/2016 18:23    ____________________________________________   PROCEDURES  Procedure(s) performed:   Procedures  Critical Care performed:   ____________________________________________   INITIAL IMPRESSION / ASSESSMENT AND PLAN / ED COURSE  Pertinent labs & imaging results that were available during my care of the patient were reviewed by me and considered in my medical decision making (see chart for details).        ____________________________________________   FINAL CLINICAL IMPRESSION(S) / ED DIAGNOSES  Final diagnoses:  Elevated INR  Dementia with behavioral disturbance, unspecified dementia type      NEW MEDICATIONS STARTED DURING THIS VISIT:  Current Discharge Medication List    START taking these medications   Details  risperiDONE (RISPERDAL) 0.25 MG tablet Take 1 tablet (0.25 mg total) by mouth 2 (two) times daily as needed (confusion or hallucinations). Qty: 60 tablet, Refills: 0         Note:  This document was prepared using Dragon voice recognition software and may include unintentional dictation errors.    Nena Polio, MD 06/05/16 [phone]

## 2016-06-06 ENCOUNTER — Emergency Department
Admission: EM | Admit: 2016-06-06 | Discharge: 2016-06-06 | Disposition: A | Discharge status: Home / Self Care | Payer: Medicare PPO | Attending: Emergency Medicine | Admitting: Emergency Medicine

## 2016-06-06 ENCOUNTER — Emergency Department: Payer: Medicare PPO

## 2016-06-06 ENCOUNTER — Encounter: Payer: Self-pay | Admitting: *Deleted

## 2016-06-06 DIAGNOSIS — R079 Chest pain, unspecified: Secondary | ICD-10-CM | POA: Diagnosis not present

## 2016-06-06 DIAGNOSIS — E039 Hypothyroidism, unspecified: Secondary | ICD-10-CM | POA: Insufficient documentation

## 2016-06-06 DIAGNOSIS — R1084 Generalized abdominal pain: Secondary | ICD-10-CM | POA: Insufficient documentation

## 2016-06-06 DIAGNOSIS — Z79899 Other long term (current) drug therapy: Secondary | ICD-10-CM | POA: Insufficient documentation

## 2016-06-06 DIAGNOSIS — Z853 Personal history of malignant neoplasm of breast: Secondary | ICD-10-CM | POA: Diagnosis not present

## 2016-06-06 DIAGNOSIS — R109 Unspecified abdominal pain: Secondary | ICD-10-CM

## 2016-06-06 LAB — HEPATIC FUNCTION PANEL
ALBUMIN: 3.5 g/dL (ref 3.5–5.0)
ALK PHOS: 82 U/L (ref 38–126)
ALT: 16 U/L (ref 14–54)
AST: 30 U/L (ref 15–41)
BILIRUBIN INDIRECT: 1 mg/dL — AB (ref 0.3–0.9)
BILIRUBIN TOTAL: 1.2 mg/dL (ref 0.3–1.2)
Bilirubin, Direct: 0.2 mg/dL (ref 0.1–0.5)
Total Protein: 6.8 g/dL (ref 6.5–8.1)

## 2016-06-06 LAB — CBC
HEMATOCRIT: 39.7 % (ref 35.0–47.0)
HEMOGLOBIN: 13.5 g/dL (ref 12.0–16.0)
MCH: 30.2 pg (ref 26.0–34.0)
MCHC: 34 g/dL (ref 32.0–36.0)
MCV: 88.9 fL (ref 80.0–100.0)
Platelets: 281 10*3/uL (ref 150–440)
RBC: 4.47 MIL/uL (ref 3.80–5.20)
RDW: 13.3 % (ref 11.5–14.5)
WBC: 7.5 10*3/uL (ref 3.6–11.0)

## 2016-06-06 LAB — BASIC METABOLIC PANEL
ANION GAP: 9 (ref 5–15)
BUN: 12 mg/dL (ref 6–20)
CALCIUM: 9.3 mg/dL (ref 8.9–10.3)
CHLORIDE: 102 mmol/L (ref 101–111)
CO2: 29 mmol/L (ref 22–32)
Creatinine, Ser: 0.81 mg/dL (ref 0.44–1.00)
GFR calc non Af Amer: >60 mL/min (ref >60)
Glucose, Bld: 118 mg/dL — ABNORMAL HIGH (ref 65–99)
Potassium: 3.2 mmol/L — ABNORMAL LOW (ref 3.5–5.1)
Sodium: 140 mmol/L (ref 135–145)

## 2016-06-06 LAB — PROTIME-INR
INR: 3.44
Prothrombin Time: 35.5 seconds — ABNORMAL HIGH (ref 11.4–15.2)

## 2016-06-06 LAB — TROPONIN I

## 2016-06-06 LAB — LIPASE, BLOOD: LIPASE: 21 U/L (ref 11–51)

## 2016-06-06 MED ORDER — IOPAMIDOL (ISOVUE-300) INJECTION 61%
30.0000 mL | Freq: Once | INTRAVENOUS | Status: AC | PRN
  Administered 2016-06-06: 30 mL via ORAL

## 2016-06-06 MED ORDER — METHYLPREDNISOLONE SODIUM SUCC 125 MG IJ SOLR
INTRAMUSCULAR | Status: AC
  Filled 2016-06-06: qty 2

## 2016-06-06 MED ORDER — IOPAMIDOL (ISOVUE-300) INJECTION 61%
100.0000 mL | Freq: Once | INTRAVENOUS | Status: AC | PRN
  Administered 2016-06-06: 100 mL via INTRAVENOUS

## 2016-06-06 MED ORDER — ALBUTEROL SULFATE (2.5 MG/3ML) 0.083% IN NEBU
INHALATION_SOLUTION | RESPIRATORY_TRACT | Status: DC
Start: 2016-06-06 — End: 2016-06-06
  Filled 2016-06-06: qty 3

## 2016-06-06 MED ORDER — KETOROLAC TROMETHAMINE 30 MG/ML IJ SOLN
INTRAMUSCULAR | Status: AC
  Filled 2016-06-06: qty 1

## 2016-06-06 NOTE — ED Provider Notes (Addendum)
[facility] Emergency Department Provider Note   ____________________________________________   First MD Initiated Contact with Patient 06/06/16 [phone]     (approximate)  I have reviewed the triage vital signs and the nursing notes.   HISTORY  Chief Complaint Chest Pain    HPI Loretta Johns is a 83 y.o. female who was seen yesterday for a INR greater than 10. She returns because she developed pain in the upper abdomen lower chest last night and it continues. The pain is sharp at times gets better but never goes away completely she has no nausea vomiting or diarrhea possibly a little bit of shortness of breath. Pain is mostly in the abdomen and on exam is diffuse in the abdomen. It appears to be moderate in intensity  Past Medical History:  Diagnosis Date  . Breast cancer ([place])   . Cervicodynia   . Chronic back pain   . Chronic cystitis   . Dysphagia   . Esophageal reflux   . HBP (high blood pressure)   . History of augmentation mammoplasty   . History of renal stone   . Hypothyroidism   . IBS (irritable bowel syndrome)   . Incontinence   . Interstitial cystitis   . Microscopic hematuria   . Polio     Patient Active Problem List   Diagnosis Date Noted  . Dementia, vascular, mixed, with behavioral disturbance 06/05/2016  . Splenic infarct 01/27/2016  . Acquired hypothyroidism 08/17/2014  . Essential (primary) hypertension 08/17/2014  . Suprapubic pain 08/17/2014  . History of renal stone 08/17/2014  . Blood glucose elevated 07/24/2014  . Drug intolerance 07/23/2014  . Atrial fibrillation with rapid ventricular response ([place]) 07/31/2013  . AF (paroxysmal atrial fibrillation) ([place]) 07/31/2013  . Acid reflux 07/21/2013  . Carotid artery narrowing 06/18/2013  . H/O transient cerebral ischemia 06/18/2013  . Accumulation of fluid in tissues 08/12/2012  . Hypercholesterolemia without hypertriglyceridemia 07/21/2012  . Cannot sleep  04/18/2011  . Post poliomyelitis syndrome 04/18/2011    Past Surgical History:  Procedure Laterality Date  . ABDOMINAL HYSTERECTOMY  1980  . BREAST ENHANCEMENT SURGERY    . CHOLECYSTECTOMY  1996  . CYSTOSCOPY  09/25/2009   with hydrodilation and bladder biopsy and fulgeration of trigone  . KNEE SURGERY Left   . Urethral polyps  07/01/1991    Prior to Admission medications   Medication Sig Start Date End Date Taking? Authorizing Provider  diltiazem (CARDIZEM CD) 120 MG 24 hr capsule Take 120 mg by mouth daily. 01/09/16  Yes Historical Provider, MD  hydrochlorothiazide (MICROZIDE) 12.5 MG capsule Take 12.5 mg by mouth daily.   Yes Historical Provider, MD  HYDROcodone-acetaminophen (NORCO) 10-325 MG tablet Take 0.5-1 tablets by mouth every 6 (six) hours as needed for severe pain. 01/28/16  Yes Srikar Sudini, MD  hyoscyamine (LEVSIN, ANASPAZ) 0.125 MG tablet Take 0.125 mg by mouth every 4 (four) hours as needed for bladder spasms.  03/17/15  Yes Historical Provider, MD  levothyroxine (SYNTHROID, LEVOTHROID) 88 MCG tablet Take 88 mcg by mouth daily before breakfast.    Yes Historical Provider, MD  risperiDONE (RISPERDAL) 0.25 MG tablet Take 1 tablet (0.25 mg total) by mouth 2 (two) times daily as needed (confusion or hallucinations). 06/05/16  Yes Gonzella Lex, MD  apixaban (ELIQUIS) 5 MG TABS tablet Take 1 tablet (5 mg total) by mouth 2 (two) times daily. Patient not taking: Reported on 06/06/2016 01/28/16   Hillary Bow, MD  dicyclomine (BENTYL) 10 MG capsule  Take 1 capsule (10 mg total) by mouth 4 (four) times daily -  before meals and at bedtime. Patient not taking: Reported on 06/06/2016 01/28/16   Hillary Bow, MD  ondansetron (ZOFRAN) 4 MG tablet Take 1 tablet (4 mg total) by mouth every 6 (six) hours as needed for nausea. Patient not taking: Reported on 06/06/2016 01/28/16   Hillary Bow, MD  XARELTO 20 MG TABS tablet Take 20 mg by mouth daily with breakfast. 03/06/16   Historical  Provider, MD    Allergies [place] grass allergy skin test; Chocolate flavor; Midazolam; Pravastatin; Propoxyphene napsylate [propoxyphene]; and Reglan [metoclopramide]  Family History  Problem Relation Age of Onset  . CAD Mother   . Cancer Father   . Kidney disease Brother     Reanl cancer  . Prostate cancer Neg Hx     Social History Social History  Substance Use Topics  . Smoking status: Never Smoker  . Smokeless tobacco: Never Used  . Alcohol use No    Review of Systems  Constitutional: No fever/chills Eyes: No visual changes. ENT: No sore throat. Cardiovascular: Denies chest painAnywhere above the bottom of the ribs.[place] Kitchen Respiratory: Denies shortness of breath. Gastrointestinal:See history of present illness Genitourinary: Negative for dysuria. Musculoskeletal: Negative for back pain. Skin: Negative for rash. Neurological: Negative for headaches, focal weakness or numbness.   ____________________________________________   PHYSICAL EXAM:  VITAL SIGNS: ED Triage Vitals  Enc Vitals Group     BP 06/06/16 1449 (!) 119/57     Pulse Rate 06/06/16 1449 89     Resp 06/06/16 1449 18     Temp 06/06/16 1446 98.5 F (36.9 C)     Temp Source 06/06/16 1446 Oral     SpO2 06/06/16 1449 94 %     Weight 06/06/16 1447 156 lb (70.8 kg)     Height 06/06/16 1447 5\' 2"  (1.575 m)     Head Circumference --      Peak Flow --      Pain Score 06/06/16 1446 5     Pain Loc --      Pain Edu? --      Excl. in [place]? --     Constitutional: Alert and oriented. Well appearing and in no acute distress. Eyes: Conjunctivae are normal. PERRL. EOMI. Head: Atraumatic. Nose: No congestion/rhinnorhea. Mouth/Throat: Mucous membranes are moist.  Oropharynx non-erythematous. Neck: No stridor.   Cardiovascular: Normal rate, regular rhythm. Grossly normal heart sounds.  Good peripheral circulation. Respiratory: Normal respiratory effort.  No retractions. Lungs CTAB. Gastrointestinal: Soft Bowel  sounds are positive there is diffuse tenderness to palpation throughout the whole abdomen. No distention. No abdominal bruits. No CVA tenderness. Musculoskeletal: No lower extremity tenderness nor edema.  No joint effusions. Neurologic:  Normal speech and language. No gross focal neurologic deficits are appreciated. No gait instability. Skin:  Skin is warm, dry and intact. No rash noted.  ____________________________________________   LABS (all labs ordered are listed, but only abnormal results are displayed)  Labs Reviewed  BASIC METABOLIC PANEL - Abnormal; Notable for the following:       Result Value   Potassium 3.2 (*)    Glucose, Bld 118 (*)    All other components within normal limits  PROTIME-INR - Abnormal; Notable for the following:    Prothrombin Time 35.5 (*)    All other components within normal limits  HEPATIC FUNCTION PANEL - Abnormal; Notable for the following:    Indirect Bilirubin 1.0 (*)    All other  components within normal limits  CBC  TROPONIN I  TROPONIN I  LIPASE, BLOOD   ____________________________________________  EKG  EKG shows a red and interpreted by me shows normal sinus rhythm rate of 91 left axis right bundle branch block similar to ekg from 2008  ____________________________________________  RADIOLOGY Study Result   CLINICAL DATA:  Left-sided chest pain and shortness of breath since yesterday.  EXAM: CHEST  2 VIEW  COMPARISON:  01/27/2016.  12/28/2007.  FINDINGS: Heart size is normal. There is aortic atherosclerosis. There is mild scarring in both lower lobes. No evidence of active infiltrate, mass, effusion or collapse. Slightly prominent suprahilar markings on the left are chronic and did not correspond with the lesion on the previous CT. No at bone findings. No pneumothorax.  IMPRESSION: No active disease.  Mild scarring at the lung bases.   Electronically Signed   By: Nelson Chimes M.D.   On: 06/06/2016 15:23     Study Result   CLINICAL DATA:  Left chest and abdominal pain for several hours  EXAM: CT ABDOMEN AND PELVIS WITH CONTRAST  TECHNIQUE: Multidetector CT imaging of the abdomen and pelvis was performed using the standard protocol following bolus administration of intravenous contrast.  CONTRAST:  115mL ISOVUE-300 IOPAMIDOL (ISOVUE-300) INJECTION 61%  COMPARISON:  01/27/2016  FINDINGS: Lower chest: Scarring is noted in the bases bilaterally stable from the prior exam.  Hepatobiliary: Prior cholecystectomy is noted. Tiny subcapsular hypodensity is again noted and stable, too small for adequate characterization.  Pancreas: Unremarkable. No pancreatic ductal dilatation or surrounding inflammatory changes.  Spleen: Normal in size. Small wedge defect is noted consistent with a prior infarct.  Adrenals/Urinary Tract: Right adrenal gland is within normal limits. Small left adrenal nodule is again identified likely representing an adenoma. This is stable from the prior exam. Parapelvic cysts are noted again on the left. Bladder is partially distended.  Stomach/Bowel: Diffuse diverticular change is noted without inflammatory change. The appendix is within normal limits. No obstructive changes are noted.  Vascular/Lymphatic: Aortic atherosclerosis. No enlarged abdominal or pelvic lymph nodes.  Reproductive: Status post hysterectomy. No adnexal masses.  Other: No abdominal wall hernia or abnormality. No abdominopelvic ascites.  Musculoskeletal: Degenerative changes of the lumbar spine are noted. Some fatty replacement of pelvic musculature is noted.  IMPRESSION: Chronic changes without acute abnormality. No significant change from the prior exam is noted.   Electronically Signed   By: Inez Catalina M.D.   On: 06/06/2016 20:52      ____________________________________________   PROCEDURES  Procedure(s) performed:   Procedures  Critical Care  performed:   ____________________________________________   INITIAL IMPRESSION / ASSESSMENT AND PLAN / ED COURSE  Pertinent labs & imaging results that were available during my care of the patient were reviewed by me and considered in my medical decision making (see chart for details).    Clinical Course as of Jun 08 357  Wed Jun 06, 2016  2037 Indirect Bilirubin: (!) 1.0 [PM]    Clinical Course User Index [PM] Nena Polio, MD     ____________________________________________   FINAL CLINICAL IMPRESSION(S) / ED DIAGNOSES  Final diagnoses:  Chest pain, unspecified type  Abdominal pain, unspecified abdominal location   Chest pain in low chest at bottom of ribs   NEW MEDICATIONS STARTED DURING THIS VISIT:  Discharge Medication List as of 06/06/2016  9:00 PM       Note:  This document was prepared using Dragon voice recognition software and may include unintentional dictation  errors.    Nena Polio, MD 06/06/16 3254    Nena Polio, MD 06/08/16 [phone]

## 2016-06-06 NOTE — ED Triage Notes (Signed)
Pt states left chest/abd pain that began last night, pt was in Ed last night for INR of 10, states some SOB, awake and alert in no acute distress

## 2016-06-06 NOTE — Discharge Instructions (Signed)
Please follow-up with [facility] clinic tomorrow. Have them check on your belly pain and lower chest pain. Please return sooner you're worse at all.

## 2016-06-07 ENCOUNTER — Ambulatory Visit
Admission: RE | Admit: 2016-06-07 | Discharge: 2016-06-07 | Disposition: A | Discharge status: Home / Self Care | Payer: Medicare PPO | Source: Ambulatory Visit | Attending: Nurse Practitioner | Admitting: Nurse Practitioner

## 2016-06-07 DIAGNOSIS — R413 Other amnesia: Secondary | ICD-10-CM | POA: Insufficient documentation

## 2016-06-07 DIAGNOSIS — Z981 Arthrodesis status: Secondary | ICD-10-CM | POA: Diagnosis not present

## 2016-06-07 DIAGNOSIS — Z8673 Personal history of transient ischemic attack (TIA), and cerebral infarction without residual deficits: Secondary | ICD-10-CM | POA: Diagnosis present

## 2016-06-07 DIAGNOSIS — G9389 Other specified disorders of brain: Secondary | ICD-10-CM | POA: Diagnosis not present

## 2016-06-07 MED ORDER — GADOBENATE DIMEGLUMINE 529 MG/ML IV SOLN
15.0000 mL | Freq: Once | INTRAVENOUS | Status: AC | PRN
  Administered 2016-06-07: 14 mL via INTRAVENOUS

## 2016-10-15 ENCOUNTER — Emergency Department: Payer: Medicare PPO

## 2016-10-15 ENCOUNTER — Encounter: Payer: Self-pay | Admitting: Emergency Medicine

## 2016-10-15 ENCOUNTER — Emergency Department
Admission: EM | Admit: 2016-10-15 | Discharge: 2016-10-15 | Disposition: A | Discharge status: Home / Self Care | Payer: Medicare PPO | Attending: Emergency Medicine | Admitting: Emergency Medicine

## 2016-10-15 DIAGNOSIS — R1084 Generalized abdominal pain: Secondary | ICD-10-CM | POA: Diagnosis not present

## 2016-10-15 DIAGNOSIS — R109 Unspecified abdominal pain: Secondary | ICD-10-CM

## 2016-10-15 DIAGNOSIS — K5792 Diverticulitis of intestine, part unspecified, without perforation or abscess without bleeding: Secondary | ICD-10-CM

## 2016-10-15 DIAGNOSIS — K5790 Diverticulosis of intestine, part unspecified, without perforation or abscess without bleeding: Secondary | ICD-10-CM | POA: Diagnosis not present

## 2016-10-15 DIAGNOSIS — Z79899 Other long term (current) drug therapy: Secondary | ICD-10-CM | POA: Insufficient documentation

## 2016-10-15 DIAGNOSIS — E039 Hypothyroidism, unspecified: Secondary | ICD-10-CM | POA: Diagnosis not present

## 2016-10-15 LAB — CBC WITH DIFFERENTIAL/PLATELET
Basophils Absolute: 0 10*3/uL (ref 0–0.1)
Basophils Relative: 1 %
EOS PCT: 0 %
Eosinophils Absolute: 0 10*3/uL (ref 0–0.7)
HEMATOCRIT: 38.6 % (ref 35.0–47.0)
Hemoglobin: 13.3 g/dL (ref 12.0–16.0)
LYMPHS ABS: 1.2 10*3/uL (ref 1.0–3.6)
LYMPHS PCT: 15 %
MCH: 31.5 pg (ref 26.0–34.0)
MCHC: 34.5 g/dL (ref 32.0–36.0)
MCV: 91.3 fL (ref 80.0–100.0)
MONO ABS: 0.8 10*3/uL (ref 0.2–0.9)
Monocytes Relative: 10 %
NEUTROS ABS: 5.9 10*3/uL (ref 1.4–6.5)
Neutrophils Relative %: 74 %
PLATELETS: 168 10*3/uL (ref 150–440)
RBC: 4.23 MIL/uL (ref 3.80–5.20)
RDW: 13.7 % (ref 11.5–14.5)
WBC: 7.9 10*3/uL (ref 3.6–11.0)

## 2016-10-15 LAB — COMPREHENSIVE METABOLIC PANEL
ALT: 15 U/L (ref 14–54)
AST: 58 U/L — ABNORMAL HIGH (ref 15–41)
Albumin: 3.6 g/dL (ref 3.5–5.0)
Alkaline Phosphatase: 78 U/L (ref 38–126)
Anion gap: 6 (ref 5–15)
BILIRUBIN TOTAL: 1 mg/dL (ref 0.3–1.2)
BUN: 13 mg/dL (ref 6–20)
CHLORIDE: 101 mmol/L (ref 101–111)
CO2: 28 mmol/L (ref 22–32)
CREATININE: 0.86 mg/dL (ref 0.44–1.00)
Calcium: 8.7 mg/dL — ABNORMAL LOW (ref 8.9–10.3)
Glucose, Bld: 157 mg/dL — ABNORMAL HIGH (ref 65–99)
Potassium: 3.5 mmol/L (ref 3.5–5.1)
Sodium: 135 mmol/L (ref 135–145)
TOTAL PROTEIN: 6.7 g/dL (ref 6.5–8.1)

## 2016-10-15 LAB — URINALYSIS, COMPLETE (UACMP) WITH MICROSCOPIC
Bilirubin Urine: NEGATIVE
GLUCOSE, UA: NEGATIVE mg/dL
Ketones, ur: NEGATIVE mg/dL
Leukocytes, UA: NEGATIVE
Nitrite: NEGATIVE
PH: 7 (ref 5.0–8.0)
PROTEIN: NEGATIVE mg/dL
SPECIFIC GRAVITY, URINE: 1.011 (ref 1.005–1.030)
SQUAMOUS EPITHELIAL / LPF: NONE SEEN

## 2016-10-15 LAB — MAGNESIUM: Magnesium: 1.7 mg/dL (ref 1.7–2.4)

## 2016-10-15 LAB — LIPASE, BLOOD: LIPASE: 17 U/L (ref 11–51)

## 2016-10-15 MED ORDER — IOPAMIDOL (ISOVUE-300) INJECTION 61%
30.0000 mL | Freq: Once | INTRAVENOUS | Status: AC | PRN
  Administered 2016-10-15: 30 mL via ORAL

## 2016-10-15 MED ORDER — CIPROFLOXACIN HCL 500 MG PO TABS: 500.0000 mg | ORAL_TABLET | Freq: Two times a day (BID) | ORAL | 0 refills | Status: DC

## 2016-10-15 MED ORDER — CIPROFLOXACIN IN D5W 400 MG/200ML IV SOLN: 400.0000 mg | Freq: Once | INTRAVENOUS | Status: DC

## 2016-10-15 MED ORDER — METRONIDAZOLE 500 MG PO TABS: 500.0000 mg | ORAL_TABLET | Freq: Three times a day (TID) | ORAL | 0 refills | Status: DC

## 2016-10-15 MED ORDER — METRONIDAZOLE IN NACL 5-0.79 MG/ML-% IV SOLN: 500.0000 mg | Freq: Once | INTRAVENOUS | Status: DC

## 2016-10-15 MED ORDER — CIPROFLOXACIN HCL 500 MG PO TABS
500.0000 mg | ORAL_TABLET | Freq: Once | ORAL | Status: AC
  Administered 2016-10-15: 500 mg via ORAL
  Filled 2016-10-15: qty 1

## 2016-10-15 MED ORDER — METRONIDAZOLE 500 MG PO TABS
500.0000 mg | ORAL_TABLET | Freq: Once | ORAL | Status: AC
  Administered 2016-10-15: 500 mg via ORAL
  Filled 2016-10-15: qty 1

## 2016-10-15 MED ORDER — IOPAMIDOL (ISOVUE-300) INJECTION 61%
100.0000 mL | Freq: Once | INTRAVENOUS | Status: AC | PRN
  Administered 2016-10-15: 100 mL via INTRAVENOUS

## 2016-10-15 MED ORDER — TRAMADOL HCL 50 MG PO TABS
50.0000 mg | ORAL_TABLET | Freq: Four times a day (QID) | ORAL | 0 refills | Status: DC | PRN
Start: 2016-10-15 — End: 2016-10-26

## 2016-10-15 NOTE — Discharge Instructions (Signed)
Please seek medical attention for any high fevers, chest pain, shortness of breath, change in behavior, persistent vomiting, bloody stool or any other new or concerning symptoms.  

## 2016-10-15 NOTE — ED Notes (Signed)
Pt denies any pain upon departure. This RN assisted pt into car from wheel chair. Pt and daughter verbalized understanding of RX.

## 2016-10-15 NOTE — ED Notes (Signed)
Patient transported to CT 

## 2016-10-15 NOTE — ED Notes (Signed)
Pt unable to urinate at this time; understands we need UA.

## 2016-10-15 NOTE — ED Notes (Signed)
Pt back in room from CT 

## 2016-10-15 NOTE — ED Provider Notes (Signed)
[facility] Emergency Department Provider Note  ____________________________________________   I have reviewed the triage vital signs and the nursing notes.   HISTORY  Chief Complaint Abdominal Pain   History limited by: Dementia , some history obtained from family  HPI Paw Phoenyx Melka is a 83 y.o. female who presents to the emergency department today because of concerns for abdominal pain. It is located in the right mid abdomen. It started 3-4 days ago. It has gotten gradually worse. Initially was intermittent but has become more constant. It hurts worse when she eats. She has not had any vomiting. Last bowel movement 2 days ago. No fevers. Patient has a history of cholecystectomy and appendectomy.   Past Medical History:  Diagnosis Date  . Breast cancer ([place])   . Cervicodynia   . Chronic back pain   . Chronic cystitis   . Dysphagia   . Esophageal reflux   . HBP (high blood pressure)   . History of augmentation mammoplasty   . History of renal stone   . Hypothyroidism   . IBS (irritable bowel syndrome)   . Incontinence   . Interstitial cystitis   . Microscopic hematuria   . Polio     Patient Active Problem List   Diagnosis Date Noted  . Dementia, vascular, mixed, with behavioral disturbance 06/05/2016  . Splenic infarct 01/27/2016  . Acquired hypothyroidism 08/17/2014  . Essential (primary) hypertension 08/17/2014  . Suprapubic pain 08/17/2014  . History of renal stone 08/17/2014  . Blood glucose elevated 07/24/2014  . Drug intolerance 07/23/2014  . Atrial fibrillation with rapid ventricular response ([place]) 07/31/2013  . AF (paroxysmal atrial fibrillation) ([place]) 07/31/2013  . Acid reflux 07/21/2013  . Carotid artery narrowing 06/18/2013  . H/O transient cerebral ischemia 06/18/2013  . Accumulation of fluid in tissues 08/12/2012  . Hypercholesterolemia without hypertriglyceridemia 07/21/2012  . Cannot sleep 04/18/2011  . Post  poliomyelitis syndrome 04/18/2011    Past Surgical History:  Procedure Laterality Date  . ABDOMINAL HYSTERECTOMY  1980  . BREAST ENHANCEMENT SURGERY    . CHOLECYSTECTOMY  1996  . CYSTOSCOPY  09/25/2009   with hydrodilation and bladder biopsy and fulgeration of trigone  . KNEE SURGERY Left   . Urethral polyps  07/01/1991    Prior to Admission medications   Medication Sig Start Date End Date Taking? Authorizing Provider  apixaban (ELIQUIS) 5 MG TABS tablet Take 1 tablet (5 mg total) by mouth 2 (two) times daily. Patient not taking: Reported on 06/06/2016 01/28/16   Hillary Bow, MD  dicyclomine (BENTYL) 10 MG capsule Take 1 capsule (10 mg total) by mouth 4 (four) times daily -  before meals and at bedtime. Patient not taking: Reported on 06/06/2016 01/28/16   Hillary Bow, MD  diltiazem (CARDIZEM CD) 120 MG 24 hr capsule Take 120 mg by mouth daily. 01/09/16   [provider]  hydrochlorothiazide (MICROZIDE) 12.5 MG capsule Take 12.5 mg by mouth daily.    [provider]  HYDROcodone-acetaminophen (NORCO) 10-325 MG tablet Take 0.5-1 tablets by mouth every 6 (six) hours as needed for severe pain. 01/28/16   Hillary Bow, MD  hyoscyamine (LEVSIN, ANASPAZ) 0.125 MG tablet Take 0.125 mg by mouth every 4 (four) hours as needed for bladder spasms.  03/17/15   [provider]  levothyroxine (SYNTHROID, LEVOTHROID) 88 MCG tablet Take 88 mcg by mouth daily before breakfast.     [provider]  ondansetron (ZOFRAN) 4 MG tablet Take 1 tablet (4 mg total) by  mouth every 6 (six) hours as needed for nausea. Patient not taking: Reported on 06/06/2016 01/28/16   Hillary Bow, MD  risperiDONE (RISPERDAL) 0.25 MG tablet Take 1 tablet (0.25 mg total) by mouth 2 (two) times daily as needed (confusion or hallucinations). 06/05/16   Clapacs, Madie Reno, MD  XARELTO 20 MG TABS tablet Take 20 mg by mouth daily with breakfast. 03/06/16   [provider]     Allergies Guatemala grass extract; Chocolate flavor; Midazolam; Pravastatin; Propoxyphene napsylate [propoxyphene]; and Reglan [metoclopramide]  Family History  Problem Relation Age of Onset  . CAD Mother   . Cancer Father   . Kidney disease Brother        Reanl cancer  . Prostate cancer Neg Hx     Social History Social History  Substance Use Topics  . Smoking status: Never Smoker  . Smokeless tobacco: Never Used  . Alcohol use No    Review of Systems Constitutional: No fever/chills Eyes: No visual changes. ENT: No sore throat. Cardiovascular: Denies chest pain. Respiratory: Denies shortness of breath. Gastrointestinal: Positive for abdominal pain.  Genitourinary: Negative for dysuria. Musculoskeletal: Negative for back pain. Skin: Negative for rash. Neurological: Negative for headaches, focal weakness or numbness.  ____________________________________________   PHYSICAL EXAM:  VITAL SIGNS: ED Triage Vitals  Enc Vitals Group     BP 10/15/16 0810 (!) 105/56     Pulse Rate 10/15/16 0810 99     Resp 10/15/16 0810 18     Temp 10/15/16 0810 98.1 F (36.7 C)     Temp Source 10/15/16 0810 Oral     SpO2 10/15/16 0810 100 %     Weight 10/15/16 0810 156 lb (70.8 kg)     Height --      Head Circumference --      Peak Flow --      Pain Score 10/15/16 0808 5   Constitutional: Awake and alert. Well appearing and in no distress. Eyes: Conjunctivae are normal.  ENT   Head: Normocephalic and atraumatic.   Nose: No congestion/rhinnorhea.   Mouth/Throat: Mucous membranes are moist.   Neck: No stridor. Hematological/Lymphatic/Immunilogical: No cervical lymphadenopathy. Cardiovascular: Normal rate, regular rhythm.  No murmurs, rubs, or gallops.  Respiratory: Normal respiratory effort without tachypnea nor retractions. Breath sounds are clear and equal bilaterally. No wheezes/rales/rhonchi. Gastrointestinal: Soft and tender to palpation in the right abdomen.  No rebound. No guarding.  Genitourinary: Deferred Musculoskeletal: Normal range of motion in all extremities. No lower extremity edema. Neurologic:  Normal speech and language. No gross focal neurologic deficits are appreciated.  Skin:  Skin is warm, dry and intact. No rash noted. Psychiatric: Mood and affect are normal. Speech and behavior are normal. Patient exhibits appropriate insight and judgment.  ____________________________________________    LABS (pertinent positives/negatives)  Labs Reviewed  COMPREHENSIVE METABOLIC PANEL - Abnormal; Notable for the following:       Result Value   Glucose, Bld 157 (*)    Calcium 8.7 (*)    AST 58 (*)    All other components within normal limits  URINALYSIS, COMPLETE (UACMP) WITH MICROSCOPIC - Abnormal; Notable for the following:    Color, Urine COLORLESS (*)    APPearance CLEAR (*)    Hgb urine dipstick SMALL (*)    Bacteria, UA RARE (*)    All other components within normal limits  CBC WITH DIFFERENTIAL/PLATELET  LIPASE, BLOOD  MAGNESIUM     ____________________________________________   EKG  None  ____________________________________________    RADIOLOGY  CT abd/pel IMPRESSION: 1. Mid sigmoid diverticulitis without abscess. Consider colonoscopic follow-up to exclude mucosal lesion. 2. Aortic Atherosclerosis (ICD10-170.0)  ____________________________________________   PROCEDURES  Procedures  ____________________________________________   INITIAL IMPRESSION / ASSESSMENT AND PLAN / ED COURSE  Pertinent labs & imaging results that were available during my care of the patient were reviewed by me and considered in my medical decision making (see chart for details).  Patient presented to the emergency department today because of concerns over abdominal pain. The patient underwent a CT scan which was concerning for diverticulitis. Patient will be given persistent antibiotics emergency department. Will discharge  with further antibiotics.  Drug database checked.  ____________________________________________   FINAL CLINICAL IMPRESSION(S) / ED DIAGNOSES  Final diagnoses:  Abdominal pain, unspecified abdominal location  Diverticulitis     Note: This dictation was prepared with Dragon dictation. Any transcriptional errors that result from this process are unintentional     Nance Pear, MD 10/15/16 1248

## 2016-10-15 NOTE — ED Triage Notes (Signed)
Pt to ed with c/o right side abd pain, denies n/v/d.  Reports constipation x several days.

## 2016-10-22 ENCOUNTER — Emergency Department: Payer: Medicare PPO

## 2016-10-22 ENCOUNTER — Encounter: Payer: Self-pay | Admitting: *Deleted

## 2016-10-22 ENCOUNTER — Inpatient Hospital Stay
Admission: EM | Admit: 2016-10-22 | Discharge: 2016-10-26 | DRG: 392 | Disposition: A | Discharge status: Skilled Nursing Facility | Payer: Medicare PPO | Attending: Internal Medicine | Admitting: Internal Medicine

## 2016-10-22 DIAGNOSIS — Z7901 Long term (current) use of anticoagulants: Secondary | ICD-10-CM | POA: Diagnosis not present

## 2016-10-22 DIAGNOSIS — E039 Hypothyroidism, unspecified: Secondary | ICD-10-CM | POA: Diagnosis present

## 2016-10-22 DIAGNOSIS — K5732 Diverticulitis of large intestine without perforation or abscess without bleeding: Principal | ICD-10-CM | POA: Diagnosis present

## 2016-10-22 DIAGNOSIS — Z888 Allergy status to other drugs, medicaments and biological substances status: Secondary | ICD-10-CM | POA: Diagnosis not present

## 2016-10-22 DIAGNOSIS — I480 Paroxysmal atrial fibrillation: Secondary | ICD-10-CM | POA: Diagnosis present

## 2016-10-22 DIAGNOSIS — N289 Disorder of kidney and ureter, unspecified: Secondary | ICD-10-CM

## 2016-10-22 DIAGNOSIS — G8929 Other chronic pain: Secondary | ICD-10-CM | POA: Diagnosis present

## 2016-10-22 DIAGNOSIS — M545 Low back pain: Secondary | ICD-10-CM | POA: Diagnosis present

## 2016-10-22 DIAGNOSIS — F0390 Unspecified dementia without behavioral disturbance: Secondary | ICD-10-CM | POA: Diagnosis present

## 2016-10-22 DIAGNOSIS — Z23 Encounter for immunization: Secondary | ICD-10-CM

## 2016-10-22 DIAGNOSIS — K589 Irritable bowel syndrome without diarrhea: Secondary | ICD-10-CM | POA: Diagnosis present

## 2016-10-22 DIAGNOSIS — K219 Gastro-esophageal reflux disease without esophagitis: Secondary | ICD-10-CM | POA: Diagnosis present

## 2016-10-22 DIAGNOSIS — I10 Essential (primary) hypertension: Secondary | ICD-10-CM | POA: Diagnosis present

## 2016-10-22 DIAGNOSIS — Z79899 Other long term (current) drug therapy: Secondary | ICD-10-CM

## 2016-10-22 DIAGNOSIS — N179 Acute kidney failure, unspecified: Secondary | ICD-10-CM | POA: Diagnosis present

## 2016-10-22 DIAGNOSIS — N3010 Interstitial cystitis (chronic) without hematuria: Secondary | ICD-10-CM | POA: Diagnosis present

## 2016-10-22 DIAGNOSIS — Z853 Personal history of malignant neoplasm of breast: Secondary | ICD-10-CM

## 2016-10-22 DIAGNOSIS — E860 Dehydration: Secondary | ICD-10-CM | POA: Diagnosis present

## 2016-10-22 DIAGNOSIS — K5792 Diverticulitis of intestine, part unspecified, without perforation or abscess without bleeding: Secondary | ICD-10-CM

## 2016-10-22 DIAGNOSIS — E876 Hypokalemia: Secondary | ICD-10-CM | POA: Diagnosis present

## 2016-10-22 DIAGNOSIS — R112 Nausea with vomiting, unspecified: Secondary | ICD-10-CM | POA: Diagnosis present

## 2016-10-22 DIAGNOSIS — R1310 Dysphagia, unspecified: Secondary | ICD-10-CM | POA: Diagnosis present

## 2016-10-22 HISTORY — DX: Unspecified dementia, unspecified severity, without behavioral disturbance, psychotic disturbance, mood disturbance, and anxiety: F03.90

## 2016-10-22 LAB — COMPREHENSIVE METABOLIC PANEL
ALK PHOS: 59 U/L (ref 38–126)
ALT: 17 U/L (ref 14–54)
AST: 36 U/L (ref 15–41)
Albumin: 3.4 g/dL — ABNORMAL LOW (ref 3.5–5.0)
Anion gap: 10 (ref 5–15)
BUN: 16 mg/dL (ref 6–20)
CALCIUM: 8.8 mg/dL — AB (ref 8.9–10.3)
CHLORIDE: 102 mmol/L (ref 101–111)
CO2: 27 mmol/L (ref 22–32)
CREATININE: 2.06 mg/dL — AB (ref 0.44–1.00)
GFR calc non Af Amer: 21 mL/min — ABNORMAL LOW (ref >60)
GFR, EST AFRICAN AMERICAN: 25 mL/min — AB (ref >60)
Glucose, Bld: 122 mg/dL — ABNORMAL HIGH (ref 65–99)
Potassium: 3.2 mmol/L — ABNORMAL LOW (ref 3.5–5.1)
Sodium: 139 mmol/L (ref 135–145)
TOTAL PROTEIN: 6 g/dL — AB (ref 6.5–8.1)
Total Bilirubin: 0.7 mg/dL (ref 0.3–1.2)

## 2016-10-22 LAB — URINALYSIS, COMPLETE (UACMP) WITH MICROSCOPIC
BACTERIA UA: NONE SEEN
Bilirubin Urine: NEGATIVE
Glucose, UA: NEGATIVE mg/dL
HGB URINE DIPSTICK: NEGATIVE
Ketones, ur: NEGATIVE mg/dL
NITRITE: NEGATIVE
Protein, ur: NEGATIVE mg/dL
SPECIFIC GRAVITY, URINE: 1.003 — AB (ref 1.005–1.030)
pH: 6 (ref 5.0–8.0)

## 2016-10-22 LAB — CBC
HCT: 38.1 % (ref 35.0–47.0)
Hemoglobin: 13.3 g/dL (ref 12.0–16.0)
MCH: 32.2 pg (ref 26.0–34.0)
MCHC: 34.8 g/dL (ref 32.0–36.0)
MCV: 92.6 fL (ref 80.0–100.0)
Platelets: 186 10*3/uL (ref 150–440)
RBC: 4.12 MIL/uL (ref 3.80–5.20)
RDW: 14.1 % (ref 11.5–14.5)
WBC: 5.4 10*3/uL (ref 3.6–11.0)

## 2016-10-22 LAB — LIPASE, BLOOD: LIPASE: 22 U/L (ref 11–51)

## 2016-10-22 MED ORDER — RISPERIDONE 0.5 MG PO TABS
0.5000 mg | ORAL_TABLET | Freq: Two times a day (BID) | ORAL | Status: DC
  Administered 2016-10-22 – 2016-10-26 (×8): 0.5 mg via ORAL
  Filled 2016-10-22 (×9): qty 1

## 2016-10-22 MED ORDER — ACETAMINOPHEN 325 MG PO TABS: 650.0000 mg | ORAL_TABLET | Freq: Four times a day (QID) | ORAL | Status: DC | PRN

## 2016-10-22 MED ORDER — MORPHINE SULFATE (PF) 2 MG/ML IV SOLN
2.0000 mg | Freq: Once | INTRAVENOUS | Status: AC
  Administered 2016-10-22: 2 mg via INTRAVENOUS
  Filled 2016-10-22: qty 1

## 2016-10-22 MED ORDER — CIPROFLOXACIN IN D5W 400 MG/200ML IV SOLN
400.0000 mg | INTRAVENOUS | Status: DC
  Administered 2016-10-22 – 2016-10-25 (×4): 400 mg via INTRAVENOUS
  Filled 2016-10-22 (×5): qty 200

## 2016-10-22 MED ORDER — LEVOTHYROXINE SODIUM 50 MCG PO TABS
50.0000 ug | ORAL_TABLET | Freq: Every day | ORAL | Status: DC
  Administered 2016-10-23 – 2016-10-26 (×4): 50 ug via ORAL
  Filled 2016-10-22 (×4): qty 1

## 2016-10-22 MED ORDER — METOPROLOL SUCCINATE ER 25 MG PO TB24
12.5000 mg | ORAL_TABLET | Freq: Every day | ORAL | Status: DC
  Administered 2016-10-23 – 2016-10-26 (×3): 12.5 mg via ORAL
  Filled 2016-10-22 (×4): qty 1

## 2016-10-22 MED ORDER — RIVAROXABAN 15 MG PO TABS
15.0000 mg | ORAL_TABLET | Freq: Every day | ORAL | Status: DC
  Administered 2016-10-22 – 2016-10-26 (×5): 15 mg via ORAL
  Filled 2016-10-22 (×5): qty 1

## 2016-10-22 MED ORDER — IOPAMIDOL (ISOVUE-300) INJECTION 61%
15.0000 mL | INTRAVENOUS | Status: AC
  Administered 2016-10-22: 15 mL via ORAL

## 2016-10-22 MED ORDER — ONDANSETRON HCL 4 MG/2ML IJ SOLN
4.0000 mg | Freq: Four times a day (QID) | INTRAMUSCULAR | Status: DC | PRN
  Administered 2016-10-22 – 2016-10-23 (×2): 4 mg via INTRAVENOUS
  Filled 2016-10-22 (×2): qty 2

## 2016-10-22 MED ORDER — PANTOPRAZOLE SODIUM 40 MG PO TBEC
40.0000 mg | DELAYED_RELEASE_TABLET | Freq: Two times a day (BID) | ORAL | Status: DC
  Administered 2016-10-22 – 2016-10-26 (×8): 40 mg via ORAL
  Filled 2016-10-22 (×8): qty 1

## 2016-10-22 MED ORDER — POTASSIUM CHLORIDE 20 MEQ PO PACK
40.0000 meq | PACK | Freq: Once | ORAL | Status: AC
  Administered 2016-10-22: 18:00:00 40 meq via ORAL
  Filled 2016-10-22: qty 2

## 2016-10-22 MED ORDER — DICYCLOMINE HCL 10 MG PO CAPS
10.0000 mg | ORAL_CAPSULE | Freq: Three times a day (TID) | ORAL | Status: DC
  Administered 2016-10-22 – 2016-10-26 (×14): 10 mg via ORAL
  Filled 2016-10-22 (×18): qty 1

## 2016-10-22 MED ORDER — PNEUMOCOCCAL VAC POLYVALENT 25 MCG/0.5ML IJ INJ
0.5000 mL | INJECTION | INTRAMUSCULAR | Status: AC
  Administered 2016-10-23: 09:00:00 0.5 mL via INTRAMUSCULAR
  Filled 2016-10-22: qty 0.5

## 2016-10-22 MED ORDER — ONDANSETRON HCL 4 MG PO TABS
4.0000 mg | ORAL_TABLET | Freq: Four times a day (QID) | ORAL | Status: DC | PRN
  Administered 2016-10-24: 4 mg via ORAL
  Filled 2016-10-22 (×2): qty 1

## 2016-10-22 MED ORDER — ONDANSETRON HCL 4 MG/2ML IJ SOLN
4.0000 mg | Freq: Once | INTRAMUSCULAR | Status: AC
  Administered 2016-10-22: 4 mg via INTRAVENOUS
  Filled 2016-10-22: qty 2

## 2016-10-22 MED ORDER — ACETAMINOPHEN 650 MG RE SUPP: 650.0000 mg | Freq: Four times a day (QID) | RECTAL | Status: DC | PRN

## 2016-10-22 MED ORDER — HYDROCODONE-ACETAMINOPHEN 5-325 MG PO TABS
1.0000 | ORAL_TABLET | Freq: Four times a day (QID) | ORAL | Status: DC | PRN
  Administered 2016-10-23 – 2016-10-24 (×4): 1 via ORAL
  Filled 2016-10-22 (×4): qty 1

## 2016-10-22 MED ORDER — SODIUM CHLORIDE 0.9 % IV SOLN
Freq: Once | INTRAVENOUS | Status: AC
  Administered 2016-10-22: 11:00:00 via INTRAVENOUS

## 2016-10-22 MED ORDER — POTASSIUM CHLORIDE 10 MEQ/100ML IV SOLN: 10.0000 meq | INTRAVENOUS | Status: DC

## 2016-10-22 MED ORDER — METRONIDAZOLE IN NACL 5-0.79 MG/ML-% IV SOLN
500.0000 mg | Freq: Three times a day (TID) | INTRAVENOUS | Status: DC
  Administered 2016-10-22 – 2016-10-26 (×12): 500 mg via INTRAVENOUS
  Filled 2016-10-22 (×15): qty 100

## 2016-10-22 MED ORDER — PIPERACILLIN-TAZOBACTAM 3.375 G IVPB 30 MIN
INTRAVENOUS | Status: AC
  Filled 2016-10-22: qty 50

## 2016-10-22 MED ORDER — SODIUM CHLORIDE 0.9 % IV SOLN
INTRAVENOUS | Status: DC
  Administered 2016-10-22 – 2016-10-23 (×2): via INTRAVENOUS

## 2016-10-22 MED ORDER — PIPERACILLIN-TAZOBACTAM 3.375 G IVPB 30 MIN
3.3750 g | Freq: Once | INTRAVENOUS | Status: AC
  Administered 2016-10-22: 3.375 g via INTRAVENOUS

## 2016-10-22 NOTE — Progress Notes (Signed)
[facility] responded to an OR for an AD. [place] educated the Pt and family. Pt wants to discuss it further before completion.     10/22/16 1700  Clinical Encounter Type  Visited With Patient;Patient and family together  Visit Type Initial;Spiritual support  Referral From Nurse  Consult/Referral To Chaplain  Spiritual Encounters  Spiritual Needs Literature;Prayer

## 2016-10-22 NOTE — H&P (Signed)
[facility] [place] NAME: Loretta Johns    MR#:  [id number]  DATE OF BIRTH:  [birth year]  DATE OF ADMISSION:  10/22/2016  PRIMARY CARE PHYSICIAN: [place] [place] [facility]   REQUESTING/REFERRING PHYSICIAN: Dr. Lenise Arena  CHIEF COMPLAINT:   Chief Complaint  Patient presents with  . Abdominal Pain    HISTORY OF PRESENT ILLNESS:  Loretta Johns  is a 83 y.o. female with a known history of chronic low back pain, GERD, hypothyroidism, IBS, dementia, hypertension and interstitial cystitis presents to hospital secondary to worsening abdominal pain and nausea. Her symptoms started initially with left lower quadrant abdominal pain about a week ago. She was brought to the emergency room and was diagnosed with diverticulitis. She was discharged on oral Cipro and Flagyl. However due to the size of the pills, patient was unable to take her oral antibiotics. Initial couple of days she started to turn around. But for the last 2 days her weakness returned, she's been having chills, worsening nausea and vomiting. Couple of loose stools yesterday. In her abdominal pain got worse overnight. Repeat CT abdomen showing descending colon diverticulitis along with sigmoid diverticulitis. She is being admitted for the same.   PAST MEDICAL HISTORY:   Past Medical History:  Diagnosis Date  . Breast cancer ([place])   . Cervicodynia   . Chronic back pain   . Chronic cystitis   . Dementia   . Dysphagia   . Esophageal reflux   . HBP (high blood pressure)   . History of augmentation mammoplasty   . History of renal stone   . Hypothyroidism   . IBS (irritable bowel syndrome)   . Incontinence   . Interstitial cystitis   . Microscopic hematuria   . Polio     PAST SURGICAL HISTORY:   Past Surgical History:  Procedure Laterality Date  . ABDOMINAL HYSTERECTOMY  1980  . BREAST ENHANCEMENT SURGERY    . CHOLECYSTECTOMY  1996  . CYSTOSCOPY   09/25/2009   with hydrodilation and bladder biopsy and fulgeration of trigone  . KNEE SURGERY Left   . Urethral polyps  07/01/1991    SOCIAL HISTORY:   Social History  Substance Use Topics  . Smoking status: Never Smoker  . Smokeless tobacco: Never Used  . Alcohol use No    FAMILY HISTORY:   Family History  Problem Relation Age of Onset  . CAD Mother   . Cancer Father   . Kidney disease Brother        Reanl cancer  . Prostate cancer Neg Hx     DRUG ALLERGIES:   Allergies  Allergen Reactions  . [place] Grass Extract Itching  . Chocolate Flavor Other (See Comments)    Reaction: unknown  . Midazolam Other (See Comments)    Reaction: trouble waking up  . Pravastatin Other (See Comments)    WAS UNABLE TO WALK PER PT.  Marland Kitchen Propoxyphene Napsylate [Propoxyphene] Other (See Comments)    Darvocet-N 100 Reaction: unknown  . Reglan [Metoclopramide] Other (See Comments)    Reaction: felt like tongue was "drawing up"    REVIEW OF SYSTEMS:   Review of Systems  Constitutional: Positive for malaise/fatigue. Negative for chills, fever and weight loss.  HENT: Negative for ear discharge, ear pain, hearing loss and nosebleeds.   Eyes: Negative for blurred vision, double vision and photophobia.  Respiratory: Negative for cough, hemoptysis, shortness of breath and wheezing.   Cardiovascular: Positive for leg swelling.  Negative for chest pain, palpitations and orthopnea.  Gastrointestinal: Positive for abdominal pain, diarrhea and nausea. Negative for constipation, heartburn, melena and vomiting.  Genitourinary: Negative for dysuria, frequency and urgency.  Musculoskeletal: Negative for back pain, myalgias and neck pain.  Skin: Negative for rash.  Neurological: Negative for dizziness, sensory change, speech change, focal weakness and headaches.  Endo/Heme/Allergies: Does not bruise/bleed easily.  Psychiatric/Behavioral: Negative for depression.    MEDICATIONS AT HOME:   Prior to  Admission medications   Medication Sig Start Date End Date Taking? Authorizing Provider  ciprofloxacin (CIPRO) 500 MG tablet Take 1 tablet (500 mg total) by mouth 2 (two) times daily. 10/15/16 10/25/16 Yes Nance Pear, MD  levothyroxine (SYNTHROID, LEVOTHROID) 50 MCG tablet Take 50 mcg by mouth daily before breakfast.    Yes [provider]  metoprolol succinate (TOPROL-XL) 25 MG 24 hr tablet Take 12.5 mg by mouth daily. 10/07/16  Yes [provider]  metroNIDAZOLE (FLAGYL) 500 MG tablet Take 1 tablet (500 mg total) by mouth 3 (three) times daily. 10/15/16 10/25/16 Yes Nance Pear, MD  risperiDONE (RISPERDAL) 0.25 MG tablet Take 1 tablet (0.25 mg total) by mouth 2 (two) times daily as needed (confusion or hallucinations). Patient taking differently: Take 0.5 mg by mouth 2 (two) times daily.  06/05/16  Yes Clapacs, Madie Reno, MD  XARELTO 20 MG TABS tablet Take 20 mg by mouth daily with breakfast. 03/06/16  Yes [provider]  apixaban (ELIQUIS) 5 MG TABS tablet Take 1 tablet (5 mg total) by mouth 2 (two) times daily. Patient not taking: Reported on 06/06/2016 01/28/16   Hillary Bow, MD  dicyclomine (BENTYL) 10 MG capsule Take 1 capsule (10 mg total) by mouth 4 (four) times daily -  before meals and at bedtime. Patient not taking: Reported on 06/06/2016 01/28/16   Hillary Bow, MD  hydrochlorothiazide (MICROZIDE) 12.5 MG capsule Take 12.5 mg by mouth daily.    [provider]  HYDROcodone-acetaminophen (NORCO) 10-325 MG tablet Take 0.5-1 tablets by mouth every 6 (six) hours as needed for severe pain. Patient not taking: Reported on 10/15/2016 01/28/16   Hillary Bow, MD  HYDROcodone-acetaminophen (NORCO/VICODIN) 5-325 MG tablet Take 1 tablet by mouth every 6 (six) hours as needed. 09/03/16   [provider]  hyoscyamine (LEVSIN, ANASPAZ) 0.125 MG tablet Take 0.125 mg by mouth every 4 (four) hours as needed for bladder spasms.  03/17/15   [provider]  ondansetron (ZOFRAN) 4 MG tablet Take 1 tablet (4 mg total) by mouth every 6 (six) hours as needed for nausea. Patient not taking: Reported on 06/06/2016 01/28/16   Hillary Bow, MD  traMADol (ULTRAM) 50 MG tablet Take 1 tablet (50 mg total) by mouth every 6 (six) hours as needed. 10/15/16 10/15/17  Nance Pear, MD      VITAL SIGNS:  Blood pressure 140/65, pulse 69, temperature 99 F (37.2 C), temperature source Oral, resp. rate (!) 24, height 5\' 2"  (1.575 m), weight 70.8 kg (156 lb), SpO2 97 %.  PHYSICAL EXAMINATION:   Physical Exam  GENERAL:  83 y.o.-year-old patient lying in the bed with no acute distress.  EYES: Pupils equal, round, reactive to light and accommodation. No scleral icterus. Extraocular muscles intact.  HEENT: Head atraumatic, normocephalic. Oropharynx and nasopharynx clear.  NECK:  Supple, no jugular venous distention. No thyroid enlargement, no tenderness.  LUNGS: Normal breath sounds bilaterally, no wheezing, rales,rhonchi or crepitation. No use of accessory muscles of respiration.  CARDIOVASCULAR: S1, S2 normal. No murmurs,  rubs, or gallops.  ABDOMEN: Soft, tender in LLQ,  nondistended. Bowel sounds present. No organomegaly or mass.  EXTREMITIES: No cyanosis, or clubbing. No edema of right leg, but left leg has 2+ edema- chronic from polio NEUROLOGIC: Cranial nerves II through XII are intact. Muscle strength 5/5 in all extremities except LLE which is 1/5 due to h/o Polio. Sensation intact. Gait not checked.  PSYCHIATRIC: The patient is alert and oriented to self.  SKIN: No obvious rash, lesion, or ulcer.   LABORATORY PANEL:   CBC  Recent Labs Lab 10/22/16 1009  WBC 5.4  HGB 13.3  HCT 38.1  PLT 186   ------------------------------------------------------------------------------------------------------------------  Chemistries   Recent Labs Lab 10/22/16 1009  NA 139  K 3.2*  CL 102  CO2 27  GLUCOSE 122*  BUN 16  CREATININE  2.06*  CALCIUM 8.8*  AST 36  ALT 17  ALKPHOS 59  BILITOT 0.7   ------------------------------------------------------------------------------------------------------------------  Cardiac Enzymes No results for input(s): TROPONINI in the last 168 hours. ------------------------------------------------------------------------------------------------------------------  RADIOLOGY:  Ct Abdomen Pelvis Wo Contrast  Result Date: 10/22/2016 CLINICAL DATA:  Followup of diverticulitis. Abdominal pain with fevers. EXAM: CT ABDOMEN AND PELVIS WITHOUT CONTRAST TECHNIQUE: Multidetector CT imaging of the abdomen and pelvis was performed following the standard protocol without IV contrast. COMPARISON:  10/15/2016 FINDINGS: Lower chest: Bibasilar scarring or atelectasis. Moderate cardiomegaly, without pericardial effusion. Tiny hiatal hernia. Hepatobiliary: Right hemidiaphragm elevation. Normal liver. Cholecystectomy, without biliary ductal dilatation. Pancreas: Pancreatic atrophy is moderate. No duct dilatation or dominant mass. Spleen: Normal in size, without focal abnormality. Adrenals/Urinary Tract: Normal right adrenal gland. Minimal left adrenal nodularity. No renal calculi or hydronephrosis. No hydroureter or ureteric calculi. No bladder calculi. Stomach/Bowel: Normal remainder of the stomach. Extensive colonic diverticulosis. Pericolonic inflammation surrounding the sigmoid is mild, slightly improved. A separate area of proximal descending pericolonic edema is new, including on image 37/series 2. No drainable fluid collection or free extracolonic air. Soft tissue fullness just inferior to the ileocecal valve on image 42/series 2 is new since the prior. Normal terminal ileum and appendix. Normal small bowel. Vascular/Lymphatic: Aortic and branch vessel atherosclerosis. No abdominopelvic adenopathy. Reproductive: Hysterectomy.  No adnexal mass. Other: Trace pelvic fluid is similar. Mild pelvic floor laxity.  Question packing material about the anterior pelvis and labia on image 88/ series 2. Musculoskeletal: Osteopenia. IMPRESSION: 1. Improved mild sigmoid diverticulitis. New proximal descending mild diverticulitis. 2.  Aortic Atherosclerosis (ICD10-I70.0). Electronically Signed   By: Abigail Miyamoto M.D.   On: 10/22/2016 13:58    EKG:   Orders placed or performed during the hospital encounter of 10/22/16  . EKG 12-Lead  . EKG 12-Lead    IMPRESSION AND PLAN:   Loretta Johns  is a 83 y.o. female with a known history of chronic low back pain, GERD, hypothyroidism, IBS, dementia, hypertension and interstitial cystitis presents to hospital secondary to worsening abdominal pain and nausea.  #1 acute diverticulitis-unable to tolerate oral antibiotics. -Admit, full liquid diet, IV fluids -IV Cipro and Flagyl and monitor  -nausea medicines and pain medicines  #2 GERD-worsening belching. CT with tiny hiatal hernia -Not on PPI at home. Started on twice a day Protonix here  #3 acute renal insufficiency - likely prerenal causes from dehydration. IV fluids and monitor while on antibiotics  #4 history of atrial fibrillation-rate well controlled. Continue low-dose Toprol. Also on Xarelto for anticoagulation.  #5 hypothyroidism-on Synthroid  #6 hypokalemia-being replaced  #7 DVT prophylaxis-already on Xarelto  Physical therapy consulted  All the records are reviewed and case discussed with ED provider. Management plans discussed with the patient, family and they are in agreement.  CODE STATUS: Full Code  TOTAL TIME TAKING CARE OF THIS PATIENT: 50 minutes.    Kester Stimpson M.D on 10/22/2016 at 2:57 PM  Between 7am to 6pm - Pager - [phone]  After 6pm go to [email or web address] - Proofreader  [facility]  Office  [phone]  CC: Primary care physician; [place] [place] [facility]

## 2016-10-22 NOTE — Progress Notes (Signed)
ANTIBIOTIC CONSULT NOTE - INITIAL  Pharmacy Consult for Ciprofloxacin  Indication: diverticulitis  Allergies  Allergen Reactions  . Guatemala Grass Extract Itching  . Chocolate Flavor Other (See Comments)    Reaction: unknown  . Midazolam Other (See Comments)    Reaction: trouble waking up  . Pravastatin Other (See Comments)    WAS UNABLE TO WALK PER PT.  Marland Kitchen Propoxyphene Napsylate [Propoxyphene] Other (See Comments)    Darvocet-N 100 Reaction: unknown  . Reglan [Metoclopramide] Other (See Comments)    Reaction: felt like tongue was "drawing up"    Patient Measurements: Height: 5\' 2"  (157.5 cm) Weight: 156 lb (70.8 kg) IBW/kg (Calculated) : 50.1 Adjusted Body Weight:   Vital Signs: Temp: 99 F (37.2 C) (09/10 1009) Temp Source: Oral (09/10 1009) BP: 134/59 (09/10 1500) Pulse Rate: 69 (09/10 1500) Intake/Output from previous day: No intake/output data recorded. Intake/Output from this shift: Total I/O In: 1050 [I.V.:1000; IV Piggyback:50] Out: -   Labs:  Recent Labs  10/22/16 1009  WBC 5.4  HGB 13.3  PLT 186  CREATININE 2.06*   Estimated Creatinine Clearance: 19.1 mL/min (A) (by C-G formula based on SCr of 2.06 mg/dL (H)). No results for input(s): VANCOTROUGH, VANCOPEAK, VANCORANDOM, GENTTROUGH, GENTPEAK, GENTRANDOM, TOBRATROUGH, TOBRAPEAK, TOBRARND, AMIKACINPEAK, AMIKACINTROU, AMIKACIN in the last 72 hours.   Microbiology: No results found for this or any previous visit (from the past 720 hour(s)).  Medical History: Past Medical History:  Diagnosis Date  . Breast cancer ([place])   . Cervicodynia   . Chronic back pain   . Chronic cystitis   . Dementia   . Dysphagia   . Esophageal reflux   . HBP (high blood pressure)   . History of augmentation mammoplasty   . History of renal stone   . Hypothyroidism   . IBS (irritable bowel syndrome)   . Incontinence   . Interstitial cystitis   . Microscopic hematuria   . Polio     Medications:  Prescriptions  Prior to Admission  Medication Sig Dispense Refill Last Dose  . ciprofloxacin (CIPRO) 500 MG tablet Take 1 tablet (500 mg total) by mouth 2 (two) times daily. 20 tablet 0 10/21/2016 at 1800  . levothyroxine (SYNTHROID, LEVOTHROID) 50 MCG tablet Take 50 mcg by mouth daily before breakfast.    10/21/2016 at 0800  . metoprolol succinate (TOPROL-XL) 25 MG 24 hr tablet Take 12.5 mg by mouth daily.   10/21/2016 at 0800  . metroNIDAZOLE (FLAGYL) 500 MG tablet Take 1 tablet (500 mg total) by mouth 3 (three) times daily. 30 tablet 0 10/21/2016 at 1800  . risperiDONE (RISPERDAL) 0.25 MG tablet Take 1 tablet (0.25 mg total) by mouth 2 (two) times daily as needed (confusion or hallucinations). (Patient taking differently: Take 0.5 mg by mouth 2 (two) times daily. ) 60 tablet 0 10/21/2016 at 1800  . XARELTO 20 MG TABS tablet Take 20 mg by mouth daily with breakfast.  0 10/21/2016 at 0800  . apixaban (ELIQUIS) 5 MG TABS tablet Take 1 tablet (5 mg total) by mouth 2 (two) times daily. (Patient not taking: Reported on 06/06/2016) 60 tablet 0 Not Taking at Unknown time  . dicyclomine (BENTYL) 10 MG capsule Take 1 capsule (10 mg total) by mouth 4 (four) times daily -  before meals and at bedtime. (Patient not taking: Reported on 06/06/2016) 60 capsule 0 Not Taking at Unknown time  . hydrochlorothiazide (MICROZIDE) 12.5 MG capsule Take 12.5 mg by mouth daily.   prn at prn  .  HYDROcodone-acetaminophen (NORCO) 10-325 MG tablet Take 0.5-1 tablets by mouth every 6 (six) hours as needed for severe pain. (Patient not taking: Reported on 10/15/2016) 15 tablet 0 Not Taking at Unknown time  . HYDROcodone-acetaminophen (NORCO/VICODIN) 5-325 MG tablet Take 1 tablet by mouth every 6 (six) hours as needed.   prn at prn  . hyoscyamine (LEVSIN, ANASPAZ) 0.125 MG tablet Take 0.125 mg by mouth every 4 (four) hours as needed for bladder spasms.    prn at prn  . ondansetron (ZOFRAN) 4 MG tablet Take 1 tablet (4 mg total) by mouth every 6 (six) hours as  needed for nausea. (Patient not taking: Reported on 06/06/2016) 20 tablet 0 prn at prn  . traMADol (ULTRAM) 50 MG tablet Take 1 tablet (50 mg total) by mouth every 6 (six) hours as needed. (Patient not taking: Reported on 10/22/2016) 15 tablet 0 Not Taking at prn   Assessment: Pt with CrCl = 19.1 ml/min ordered Cipro 400 mg IV Q12H for diverticulitis.   Goal of Therapy:  resolution of infection  Plan:  Expected duration 7 days with resolution of temperature and/or normalization of WBC   Will adjust dose to Cipro 400 mg IV Q24H to start 9/10 @ 1700.   Shakur Lembo D 10/22/2016,4:20 PM

## 2016-10-22 NOTE — ED Notes (Signed)
Pt reporting chest pressure and states "I've never had pain like this before".  EDP notified and EKG repeated.  No changes to POC at this time.

## 2016-10-22 NOTE — ED Triage Notes (Signed)
Pt was diagnosed last Monday with diverticulitis, states she has not able to swallow abx because they are too big and thus states continued abd pain with fevers at home

## 2016-10-22 NOTE — ED Provider Notes (Signed)
[facility] Emergency Department Provider Note       Time seen: ----------------------------------------- 10:56 AM on 10/22/2016 -----------------------------------------     I have reviewed the triage vital signs and the nursing notes.   HISTORY   Chief Complaint Abdominal Pain    HPI Loretta Johns is a 83 y.o. female who presents to the ED for weakness and abdominal pain. Patient was seen in the ER week ago was diagnosed with diverticulitis. She has been taking antibiotics but they're very hard to swallow because they're large. She continues to have abdominal pain with fevers at home. Right now she is complaining more of right-sided abdominal pain.   Past Medical History:  Diagnosis Date  . Breast cancer ([place])   . Cervicodynia   . Chronic back pain   . Chronic cystitis   . Dysphagia   . Esophageal reflux   . HBP (high blood pressure)   . History of augmentation mammoplasty   . History of renal stone   . Hypothyroidism   . IBS (irritable bowel syndrome)   . Incontinence   . Interstitial cystitis   . Microscopic hematuria   . Polio     Patient Active Problem List   Diagnosis Date Noted  . Dementia, vascular, mixed, with behavioral disturbance 06/05/2016  . Splenic infarct 01/27/2016  . Acquired hypothyroidism 08/17/2014  . Essential (primary) hypertension 08/17/2014  . Suprapubic pain 08/17/2014  . History of renal stone 08/17/2014  . Blood glucose elevated 07/24/2014  . Drug intolerance 07/23/2014  . Atrial fibrillation with rapid ventricular response ([place]) 07/31/2013  . AF (paroxysmal atrial fibrillation) ([place]) 07/31/2013  . Acid reflux 07/21/2013  . Carotid artery narrowing 06/18/2013  . H/O transient cerebral ischemia 06/18/2013  . Accumulation of fluid in tissues 08/12/2012  . Hypercholesterolemia without hypertriglyceridemia 07/21/2012  . Cannot sleep 04/18/2011  . Post poliomyelitis syndrome 04/18/2011    Past  Surgical History:  Procedure Laterality Date  . ABDOMINAL HYSTERECTOMY  1980  . BREAST ENHANCEMENT SURGERY    . CHOLECYSTECTOMY  1996  . CYSTOSCOPY  09/25/2009   with hydrodilation and bladder biopsy and fulgeration of trigone  . KNEE SURGERY Left   . Urethral polyps  07/01/1991    Allergies Guatemala grass extract; Chocolate flavor; Midazolam; Pravastatin; Propoxyphene napsylate [propoxyphene]; and Reglan [metoclopramide]  Social History Social History  Substance Use Topics  . Smoking status: Never Smoker  . Smokeless tobacco: Never Used  . Alcohol use No    Review of Systems Constitutional: Negative for fever. Eyes: Negative for vision changes ENT:  Negative for congestion, sore throat Cardiovascular: Negative for chest pain. Respiratory: Negative for shortness of breath. Gastrointestinal: Positive for abdominal pain Genitourinary: Negative for dysuria. Musculoskeletal: Negative for back pain. Skin: Negative for rash. Neurological: Positive for weakness  All systems negative/normal/unremarkable except as stated in the HPI  ____________________________________________   PHYSICAL EXAM:  VITAL SIGNS: ED Triage Vitals  Enc Vitals Group     BP 10/22/16 1009 (!) 131/53     Pulse Rate 10/22/16 1009 78     Resp 10/22/16 1009 18     Temp 10/22/16 1009 99 F (37.2 C)     Temp Source 10/22/16 1009 Oral     SpO2 10/22/16 1009 96 %     Weight 10/22/16 1012 156 lb (70.8 kg)     Height 10/22/16 1012 5\' 2"  (1.575 m)     Head Circumference --      Peak Flow --  Pain Score 10/22/16 1012 7     Pain Loc --      Pain Edu? --      Excl. in [place]? --     Constitutional: Alert and oriented. Well appearing and in no distress. Eyes: Conjunctivae are normal. Normal extraocular movements. ENT   Head: Normocephalic and atraumatic.   Nose: No congestion/rhinnorhea.   Mouth/Throat: Mucous membranes are moist.   Neck: No stridor. Cardiovascular: Normal rate, regular  rhythm. No murmurs, rubs, or gallops. Respiratory: Normal respiratory effort without tachypnea nor retractions. Breath sounds are clear and equal bilaterally. No wheezes/rales/rhonchi. Gastrointestinal:Diffuse lower abdominal tenderness, worse on the right. Musculoskeletal: Nontender with normal range of motion in extremities. No lower extremity tenderness nor edema. Neurologic:  Normal speech and language. No gross focal neurologic deficits are appreciated.  Skin:  Skin is warm, dry and intact. No rash noted. Psychiatric: Mood and affect are normal. Speech and behavior are normal.  ____________________________________________  EKG: Interpreted by me. Sinus rhythm rate of 68 bpm, normal PR interval, wide QRS, normal QT, artifact, right bundle branch block  ____________________________________________  ED COURSE:  Pertinent labs & imaging results that were available during my care of the patient were reviewed by me and considered in my medical decision making (see chart for details). Patient presents for abdominal pain and weakness, we will assess with labs and imaging as indicated.   Procedures ____________________________________________   LABS (pertinent positives/negatives)  Labs Reviewed  COMPREHENSIVE METABOLIC PANEL - Abnormal; Notable for the following:       Result Value   Potassium 3.2 (*)    Glucose, Bld 122 (*)    Creatinine, Ser 2.06 (*)    Calcium 8.8 (*)    Total Protein 6.0 (*)    Albumin 3.4 (*)    GFR calc non Af Amer 21 (*)    GFR calc Af Amer 25 (*)    All other components within normal limits  URINALYSIS, COMPLETE (UACMP) WITH MICROSCOPIC - Abnormal; Notable for the following:    Color, Urine YELLOW (*)    APPearance CLEAR (*)    Specific Gravity, Urine 1.003 (*)    Leukocytes, UA MODERATE (*)    Squamous Epithelial / LPF 0-5 (*)    Non Squamous Epithelial 0-5 (*)    All other components within normal limits  LIPASE, BLOOD  CBC    RADIOLOGY Images  were viewed by me  CT of the abdomen and pelvis with oral contrast IMPRESSION: 1. Improved mild sigmoid diverticulitis. New proximal descending mild diverticulitis. 2.  Aortic Atherosclerosis (ICD10-I70.0).  ____________________________________________  FINAL ASSESSMENT AND PLAN  Abdominal pain, diverticulitis, dehydration   Plan: Patient's labs and imaging were dictated above. Patient had presented for worsening weakness, abdominal pain and fatigue. She was seen and treated recently for diverticulitis which has both improved in one aspect of the colon and worsened in another. She also appears dehydrated with acute kidney injury likely from same. I will discuss with the hospitalist for observation. She has received IV Zosyn to treat for intra-abdominal infection, she has also received IV fluids.Earleen Newport, MD   Note: This note was generated in part or whole with voice recognition software. Voice recognition is usually quite accurate but there are transcription errors that can and very often do occur. I apologize for any typographical errors that were not detected and corrected.     Earleen Newport, MD 10/22/16 [phone]

## 2016-10-22 NOTE — Progress Notes (Signed)
ANTICOAGULATION CONSULT NOTE - Initial Consult  Pharmacy Consult for Xarelto  Indication: atrial fibrillation  Allergies  Allergen Reactions  . [place] Grass Extract Itching  . Chocolate Flavor Other (See Comments)    Reaction: unknown  . Midazolam Other (See Comments)    Reaction: trouble waking up  . Pravastatin Other (See Comments)    WAS UNABLE TO WALK PER PT.  Marland Kitchen Propoxyphene Napsylate [Propoxyphene] Other (See Comments)    Darvocet-N 100 Reaction: unknown  . Reglan [Metoclopramide] Other (See Comments)    Reaction: felt like tongue was "drawing up"    Patient Measurements: Height: 5\' 2"  (157.5 cm) Weight: 156 lb (70.8 kg) IBW/kg (Calculated) : 50.1 Heparin Dosing Weight:   Vital Signs: Temp: 99 F (37.2 C) (09/10 1009) Temp Source: Oral (09/10 1009) BP: 134/59 (09/10 1500) Pulse Rate: 69 (09/10 1500)  Labs:  Recent Labs  10/22/16 1009  HGB 13.3  HCT 38.1  PLT 186  CREATININE 2.06*    Estimated Creatinine Clearance: 19.1 mL/min (A) (by C-G formula based on SCr of 2.06 mg/dL (H)).   Medical History: Past Medical History:  Diagnosis Date  . Breast cancer ([place])   . Cervicodynia   . Chronic back pain   . Chronic cystitis   . Dementia   . Dysphagia   . Esophageal reflux   . HBP (high blood pressure)   . History of augmentation mammoplasty   . History of renal stone   . Hypothyroidism   . IBS (irritable bowel syndrome)   . Incontinence   . Interstitial cystitis   . Microscopic hematuria   . Polio     Medications:  Prescriptions Prior to Admission  Medication Sig Dispense Refill Last Dose  . ciprofloxacin (CIPRO) 500 MG tablet Take 1 tablet (500 mg total) by mouth 2 (two) times daily. 20 tablet 0 10/21/2016 at 1800  . levothyroxine (SYNTHROID, LEVOTHROID) 50 MCG tablet Take 50 mcg by mouth daily before breakfast.    10/21/2016 at 0800  . metoprolol succinate (TOPROL-XL) 25 MG 24 hr tablet Take 12.5 mg by mouth daily.   10/21/2016 at 0800  .  metroNIDAZOLE (FLAGYL) 500 MG tablet Take 1 tablet (500 mg total) by mouth 3 (three) times daily. 30 tablet 0 10/21/2016 at 1800  . risperiDONE (RISPERDAL) 0.25 MG tablet Take 1 tablet (0.25 mg total) by mouth 2 (two) times daily as needed (confusion or hallucinations). (Patient taking differently: Take 0.5 mg by mouth 2 (two) times daily. ) 60 tablet 0 10/21/2016 at 1800  . XARELTO 20 MG TABS tablet Take 20 mg by mouth daily with breakfast.  0 10/21/2016 at 0800  . apixaban (ELIQUIS) 5 MG TABS tablet Take 1 tablet (5 mg total) by mouth 2 (two) times daily. (Patient not taking: Reported on 06/06/2016) 60 tablet 0 Not Taking at Unknown time  . dicyclomine (BENTYL) 10 MG capsule Take 1 capsule (10 mg total) by mouth 4 (four) times daily -  before meals and at bedtime. (Patient not taking: Reported on 06/06/2016) 60 capsule 0 Not Taking at Unknown time  . hydrochlorothiazide (MICROZIDE) 12.5 MG capsule Take 12.5 mg by mouth daily.   prn at prn  . HYDROcodone-acetaminophen (NORCO) 10-325 MG tablet Take 0.5-1 tablets by mouth every 6 (six) hours as needed for severe pain. (Patient not taking: Reported on 10/15/2016) 15 tablet 0 Not Taking at Unknown time  . HYDROcodone-acetaminophen (NORCO/VICODIN) 5-325 MG tablet Take 1 tablet by mouth every 6 (six) hours as needed.   prn at  prn  . hyoscyamine (LEVSIN, ANASPAZ) 0.125 MG tablet Take 0.125 mg by mouth every 4 (four) hours as needed for bladder spasms.    prn at prn  . ondansetron (ZOFRAN) 4 MG tablet Take 1 tablet (4 mg total) by mouth every 6 (six) hours as needed for nausea. (Patient not taking: Reported on 06/06/2016) 20 tablet 0 prn at prn  . traMADol (ULTRAM) 50 MG tablet Take 1 tablet (50 mg total) by mouth every 6 (six) hours as needed. (Patient not taking: Reported on 10/22/2016) 15 tablet 0 Not Taking at prn    Assessment: Pt on Xarelto 20 mg PO daily at home for Afib.  Actual BW CrCl = 23.1 ml/min  Goal of Therapy:  prevention of thromboembolus   Plan:   Will adjust dose to Xarelto 15 mg PO daily to start 9/10 @ 17:00.   Loretta Johns D 10/22/2016,4:14 PM

## 2016-10-22 NOTE — ED Notes (Signed)
Pt & daughter asking if pt need to drink oral contrast at this time.  EDP verified that he would like pt to drink as much contrast as she can tolerate.  Pt sat up in bed and comfortable at this time.  Call light within reach.  Daughter & Pt informed of EDP request.  Verbalized understanding.  Pt in NAD, will continue to monitor.

## 2016-10-23 LAB — BASIC METABOLIC PANEL
ANION GAP: 8 (ref 5–15)
BUN: 14 mg/dL (ref 6–20)
CALCIUM: 8.2 mg/dL — AB (ref 8.9–10.3)
CO2: 24 mmol/L (ref 22–32)
CREATININE: 1.9 mg/dL — AB (ref 0.44–1.00)
Chloride: 109 mmol/L (ref 101–111)
GFR calc Af Amer: 27 mL/min — ABNORMAL LOW (ref >60)
GFR, EST NON AFRICAN AMERICAN: 23 mL/min — AB (ref >60)
GLUCOSE: 98 mg/dL (ref 65–99)
Potassium: 3.6 mmol/L (ref 3.5–5.1)
Sodium: 141 mmol/L (ref 135–145)

## 2016-10-23 LAB — CBC
HCT: 34.3 % — ABNORMAL LOW (ref 35.0–47.0)
HEMOGLOBIN: 11.9 g/dL — AB (ref 12.0–16.0)
MCH: 31.9 pg (ref 26.0–34.0)
MCHC: 34.8 g/dL (ref 32.0–36.0)
MCV: 91.8 fL (ref 80.0–100.0)
Platelets: 187 10*3/uL (ref 150–440)
RBC: 3.73 MIL/uL — ABNORMAL LOW (ref 3.80–5.20)
RDW: 14.1 % (ref 11.5–14.5)
WBC: 5 10*3/uL (ref 3.6–11.0)

## 2016-10-23 MED ORDER — SODIUM CHLORIDE 0.9 % IV SOLN
INTRAVENOUS | Status: DC
  Administered 2016-10-23 – 2016-10-24 (×2): via INTRAVENOUS

## 2016-10-23 MED ORDER — SODIUM CHLORIDE 0.9 % IV SOLN: INTRAVENOUS | Status: DC

## 2016-10-23 NOTE — Progress Notes (Signed)
Dr Darvin Neighbours notified of patients low blood pressure. MD acknowledged, no new orders given.

## 2016-10-23 NOTE — Evaluation (Signed)
Clinical/Bedside Swallow Evaluation Patient Details  Name: Loretta Johns MRN: [id number] Date of Birth: 01/09/1932  Today's Date: 10/23/2016 Time: SLP Start Time (ACUTE ONLY): 0900 SLP Stop Time (ACUTE ONLY): 0945 SLP Time Calculation (min) (ACUTE ONLY): 45 min  Past Medical History:  Past Medical History:  Diagnosis Date  . Breast cancer ([place])   . Cervicodynia   . Chronic back pain   . Chronic cystitis   . Dementia   . Dysphagia   . Esophageal reflux   . HBP (high blood pressure)   . History of augmentation mammoplasty   . History of renal stone   . Hypothyroidism   . IBS (irritable bowel syndrome)   . Incontinence   . Interstitial cystitis   . Microscopic hematuria   . Polio    Past Surgical History:  Past Surgical History:  Procedure Laterality Date  . ABDOMINAL HYSTERECTOMY  1980  . BREAST ENHANCEMENT SURGERY    . CHOLECYSTECTOMY  1996  . CYSTOSCOPY  09/25/2009   with hydrodilation and bladder biopsy and fulgeration of trigone  . KNEE SURGERY Left   . Urethral polyps  07/01/1991   HPI:  Pt is a 83 y.o. female with a known history of chronic low back pain, GERD, hypothyroidism, IBS, dementia, hypertension and interstitial cystitis presents to hospital secondary to worsening abdominal pain and nausea.Her symptoms started initially with left lower quadrant abdominal pain about a week ago. She was brought to the emergency room and was diagnosed with diverticulitis. She was discharged on oral Cipro and Flagyl. However due to the size of the pills, patient was unable to take her oral antibiotics. Initial couple of days she started to turn around. But for the last 2 days her weakness returned, she's been having chills, worsening nausea and vomiting. Couple of loose stools yesterday. In her abdominal pain got worse overnight. Repeat CT abdomen showing descending colon diverticulitis along with sigmoid diverticulitis. She is being admitted for the same.   Assessment /  Plan / Recommendation Clinical Impression  Pt appeared to present w/ adequate oropharyngeal phase swallow function for consistencies of thin liquids and purees; she appears at reduced risk for aspiration when following general aspiration precautions. ST evaluation during pt receiving pills by NSG. Cognitive decline/Dementia appears to be impacting her overall task of oral intake c/b hesitation taking and swallowing pills by oral holding and buccal pocketing intermittently; she was also easily distracted by her environment.  Pt was given puree, and w/ whole pills, and thin liquid by NSG and mod encouragement w/ no immediate overt s/s of aspiration were noted. Vocal quality and respiratory status did not appear to decline w/ po trials. No coughing noted. During the oral phase, pt exhibited inconsistent oral holding and pocketing while taking pills with puree, however, this was not noted during regular trials of thin liquid and purees(w/ no pills). Cognitive presentation and distraction appear to impair pt's oral intake and desire for eating overall.  Recommend reducing distractions during meals and to encourage eating/drinking. Recommend whole meds in puree to minimize hesitation of pill taking. Recommend pt self-feed w/ setup assist but more assistance w/ feeding if indicated. Pt is currently on GI full liquid diet per MD orders. Recommend regular(softer, easily cut foods) diet and thin liquids when MD approves as pt does not appear to be exhibiting any oropharyngeal phase dysfunction when swallowing; aspiration precautions.  ST services will f/u if indicated by a decline in status. MD/NSG updated.  SLP Visit Diagnosis: Dysphagia, oral phase (  R13.11) (min holding )    Aspiration Risk   (reduced risk following precautions and monitoring)    Diet Recommendation  GI full liquid w/ thin liquid per MD; Suspect pt can return to regular diet and thin liquids when MD approves.   Medication Administration: Whole  meds with puree    Other  Recommendations Recommended Consults:  (dietician f/u) Oral Care Recommendations: Oral care BID;Patient independent with oral care;Staff/trained caregiver to provide oral care   Follow up Recommendations None      Frequency and Duration            Prognosis Prognosis for Safe Diet Advancement: Good Barriers to Reach Goals: Cognitive deficits      Swallow Study   General Date of Onset: 10/22/16 HPI: Pt is a 83 y.o. female with a known history of chronic low back pain, GERD, hypothyroidism, IBS, dementia, hypertension and interstitial cystitis presents to hospital secondary to worsening abdominal pain and nausea.Her symptoms started initially with left lower quadrant abdominal pain about a week ago. She was brought to the emergency room and was diagnosed with diverticulitis. She was discharged on oral Cipro and Flagyl. However due to the size of the pills, patient was unable to take her oral antibiotics. Initial couple of days she started to turn around. But for the last 2 days her weakness returned, she's been having chills, worsening nausea and vomiting. Couple of loose stools yesterday. In her abdominal pain got worse overnight. Repeat CT abdomen showing descending colon diverticulitis along with sigmoid diverticulitis. She is being admitted for the same. Type of Study: Bedside Swallow Evaluation Previous Swallow Assessment: none reported Diet Prior to this Study: Thin liquids (GI diet - full liquid per MD ) Temperature Spikes Noted: No (wbc 5.0) Respiratory Status: Room air History of Recent Intubation: No Behavior/Cognition: Alert;Cooperative;Confused;Distractible;Requires cueing Oral Cavity Assessment: Within Functional Limits Oral Care Completed by SLP: Recent completion by staff Oral Cavity - Dentition: Dentures, top;Dentures, bottom Vision: Functional for self-feeding Self-Feeding Abilities: Able to feed self;Needs set up (requires encouragement for  feeding) Patient Positioning: Upright in bed Baseline Vocal Quality: Normal Volitional Cough: Strong Volitional Swallow: Able to elicit    Oral/Motor/Sensory Function Overall Oral Motor/Sensory Function: Within functional limits (with bolus management )   Ice Chips Ice chips: Not tested   Thin Liquid Thin Liquid: Within functional limits Presentation: Straw;Self Fed Other Comments: 4 trials water     Nectar Thick Nectar Thick Liquid: Not tested   Honey Thick Honey Thick Liquid: Not tested   Puree Puree: Within functional limits (6 oz ice cream) Presentation: Self Fed;Spoon   Solid   GO   Solid: Not tested Other Comments: d/t GI status (currently on full liquid per MD)         Carolynn Sayers, SLP-Graduate Student Carolynn Sayers 10/23/2016,10:09 AM    This information has been reviewed and agreed upon by this supervising clinician.  This patient note, response to treatment and overall treatment plan has been reviewed and this clinician agrees with the information provided.  10/23/16, 12:09 PM [place], [place], CCC-SLP

## 2016-10-23 NOTE — Plan of Care (Signed)
Problem: Education: Goal: Knowledge of [place] [facility] Education information/materials will improve Outcome: Progressing VSS, free of falls during shift.  Emesis x2, improved w/ PRN IV Zofran 4mg  x1.  No other needs overnight.  Denies pain.  Q2h repositioning.  Bed in low position, bed alarm on.  Call bell within reach, [place].

## 2016-10-23 NOTE — Progress Notes (Signed)
[facility] [place] NAME: Loretta Johns    MR#:  [id number]  DATE OF BIRTH:  December 07, 1931  SUBJECTIVE:  CHIEF COMPLAINT:   Chief Complaint  Patient presents with  . Abdominal Pain   Poor historian. Pleasantly confused Complains of occasional lower abdominal pain  REVIEW OF SYSTEMS:    Review of Systems  Unable to perform ROS: Dementia    DRUG ALLERGIES:   Allergies  Allergen Reactions  . [place] Grass Extract Itching  . Chocolate Flavor Other (See Comments)    Reaction: unknown  . Midazolam Other (See Comments)    Reaction: trouble waking up  . Pravastatin Other (See Comments)    WAS UNABLE TO WALK PER PT.  Marland Kitchen Propoxyphene Napsylate [Propoxyphene] Other (See Comments)    Darvocet-N 100 Reaction: unknown  . Reglan [Metoclopramide] Other (See Comments)    Reaction: felt like tongue was "drawing up"    VITALS:  Blood pressure (!) 116/50, pulse 77, temperature 98.8 F (37.1 C), temperature source Oral, resp. rate 20, height 5\' 2"  (1.575 m), weight 69.9 kg (154 lb), SpO2 95 %.  PHYSICAL EXAMINATION:   Physical Exam  GENERAL:  83 y.o.-year-old patient lying in the bed with no acute distress.  EYES: Pupils equal, round, reactive to light and accommodation. No scleral icterus. Extraocular muscles intact.  HEENT: Head atraumatic, normocephalic. Oropharynx and nasopharynx clear.  NECK:  Supple, no jugular venous distention. No thyroid enlargement, no tenderness.  LUNGS: Normal breath sounds bilaterally, no wheezing, rales, rhonchi. No use of accessory muscles of respiration.  CARDIOVASCULAR: S1, S2 normal. No murmurs, rubs, or gallops.  ABDOMEN: Soft, nondistended. Bowel sounds present. No organomegaly or mass. Lower abdominal tenderness EXTREMITIES: No cyanosis, clubbing or edema b/l.    NEUROLOGIC: Cranial nerves II through XII are intact. No focal Motor or sensory deficits b/l.   PSYCHIATRIC: The patient is alert and  awake. SKIN: No obvious rash, lesion, or ulcer.   LABORATORY PANEL:   CBC  Recent Labs Lab 10/23/16 0408  WBC 5.0  HGB 11.9*  HCT 34.3*  PLT 187   ------------------------------------------------------------------------------------------------------------------ Chemistries   Recent Labs Lab 10/22/16 1009 10/23/16 0408  NA 139 141  K 3.2* 3.6  CL 102 109  CO2 27 24  GLUCOSE 122* 98  BUN 16 14  CREATININE 2.06* 1.90*  CALCIUM 8.8* 8.2*  AST 36  --   ALT 17  --   ALKPHOS 59  --   BILITOT 0.7  --    ------------------------------------------------------------------------------------------------------------------  Cardiac Enzymes No results for input(s): TROPONINI in the last 168 hours. ------------------------------------------------------------------------------------------------------------------  RADIOLOGY:  Ct Abdomen Pelvis Wo Contrast  Result Date: 10/22/2016 CLINICAL DATA:  Followup of diverticulitis. Abdominal pain with fevers. EXAM: CT ABDOMEN AND PELVIS WITHOUT CONTRAST TECHNIQUE: Multidetector CT imaging of the abdomen and pelvis was performed following the standard protocol without IV contrast. COMPARISON:  10/15/2016 FINDINGS: Lower chest: Bibasilar scarring or atelectasis. Moderate cardiomegaly, without pericardial effusion. Tiny hiatal hernia. Hepatobiliary: Right hemidiaphragm elevation. Normal liver. Cholecystectomy, without biliary ductal dilatation. Pancreas: Pancreatic atrophy is moderate. No duct dilatation or dominant mass. Spleen: Normal in size, without focal abnormality. Adrenals/Urinary Tract: Normal right adrenal gland. Minimal left adrenal nodularity. No renal calculi or hydronephrosis. No hydroureter or ureteric calculi. No bladder calculi. Stomach/Bowel: Normal remainder of the stomach. Extensive colonic diverticulosis. Pericolonic inflammation surrounding the sigmoid is mild, slightly improved. A separate area of proximal descending pericolonic  edema is new, including on image 37/series 2. No  drainable fluid collection or free extracolonic air. Soft tissue fullness just inferior to the ileocecal valve on image 42/series 2 is new since the prior. Normal terminal ileum and appendix. Normal small bowel. Vascular/Lymphatic: Aortic and branch vessel atherosclerosis. No abdominopelvic adenopathy. Reproductive: Hysterectomy.  No adnexal mass. Other: Trace pelvic fluid is similar. Mild pelvic floor laxity. Question packing material about the anterior pelvis and labia on image 88/ series 2. Musculoskeletal: Osteopenia. IMPRESSION: 1. Improved mild sigmoid diverticulitis. New proximal descending mild diverticulitis. 2.  Aortic Atherosclerosis (ICD10-I70.0). Electronically Signed   By: Abigail Miyamoto M.D.   On: 10/22/2016 13:58     ASSESSMENT AND PLAN:   Loretta Johns  is a 82 y.o. female with a known history of chronic low back pain, GERD, hypothyroidism, IBS, dementia, hypertension and interstitial cystitis presents to hospital secondary to worsening abdominal pain and nausea.  * acute sigmoid diverticulitis We'll advance to soft diet. Continue IV antibiotics. IV fluids. No diarrhea. Mild pain. Patient will need liquid antibiotic at discharge.  * GERD. On Protonix.  * acute kidney injury Due to decreased oral intake and dehydration. On IV fluids.Repeat labs in the morning.  * paroxysmal atrial fibrillation. On Toprol and Xarelto.  * hypothyroidism-on Synthroid  * dementia. Monitor for inpatient delirium  * hypokalemia-replaced  * DVT prophylaxis-already on Xarelto  All the records are reviewed and case discussed with Care Management/Social Worker Management plans discussed with the patient, family and they are in agreement.  CODE STATUS: FULL CODE  DVT Prophylaxis: SCDs  TOTAL TIME TAKING CARE OF THIS PATIENT: 35 minutes.   POSSIBLE D/C IN 1-2 DAYS, DEPENDING ON CLINICAL CONDITION.  Hillary Bow R M.D on 10/23/2016 at  12:39 PM  Between 7am to 6pm - Pager - [phone]  After 6pm go to [email or web address] - password [email or web address] [facility]  Office  [phone]  CC: Primary care physician; [facility]  Note: This dictation was prepared with Dragon dictation along with smaller phrase technology. Any transcriptional errors that result from this process are unintentional.

## 2016-10-23 NOTE — Evaluation (Signed)
Physical Therapy Evaluation Patient Details Name: Loretta Johns MRN: [id number] DOB: 06/18/1931 Today's Date: 10/23/2016   History of Present Illness  Pt is an 83 y.o. female presenting to hospital with weakness and abdominal pain.  Pt recently seen in ER about 1 week prior and diagnosed with diverticulitis (pt unable to take antibiotic pills secondary to size though) and presented back to ED with worsening abdominal pain and nausea.  Pt admitted to hospital with acute diverticulitis, GERD, and acute renal insufficiency.  PMH includes breast CA, chronic back pain, high BP, IBS, polio, dementia, and tiny hiatal hernia.  Clinical Impression  Prior to hospital admission, pt was ambulating short distances with RW in home and used scooter outdoors/in community.  Pt lives with her daughter in 1 level home with ramp to enter.  Pt demonstrates L>R LE weakness (pt reports h/o polio with L>R LE weakness).  Currently pt is mod to max assist with bed mobility and to stand with RW.  Pt's LE's buckling when standing with RW limiting ability to take any steps.  Multiple attempts at transfer to [facility] (with multiple repositioning and cueing for safe technique) but unable to perform transfer with 1 person (B knees blocked but d/t weakness and/or pt suddenly moving her hand to tightly grab onto different surfaces that complicated transfer, therapist was not able to perform transfer).  Pt would benefit from skilled PT to address noted impairments and functional limitations (see below for any additional details).  Upon hospital discharge, recommend pt discharge to [facility].    Follow Up Recommendations SNF    Equipment Recommendations  Rolling walker with 5" wheels    Recommendations for Other Services OT consult     Precautions / Restrictions Precautions Precautions: Fall Restrictions Weight Bearing Restrictions: No      Mobility  Bed Mobility Overal bed mobility: Needs Assistance Bed Mobility: Supine to  Sit;Sit to Supine     Supine to sit: Mod assist;HOB elevated;Max assist Sit to supine: HOB elevated;Mod assist;Max assist   General bed mobility comments: assist for LE's and trunk supine to/from sit; increased effort to perform; 2 assist to boost pt up in bed  Transfers Overall transfer level: Needs assistance Equipment used: Rolling walker (2 wheeled);None Transfers: Sit to/from [employer] Transfers Sit to Stand: Mod assist;Max assist Stand pivot transfers: Total assist Squat pivot transfers: Total assist     General transfer comment: Trialed standing with RW x3 trials but after standing pt's B LE's buckled each time before able to take any steps; trialed/attempted stand pivot transfer with pt x3 trials and squat pivot transfer x4 trials but (with 1 assist) unable to transfer to bedside commode (for any of the trials/attempts) even with adjustments to technique/positioning and max cueing for technique (pt's assisted back to sitting on edge of bed each trial/attempt)  Ambulation/Gait             General Gait Details: not appropriate at this time d/t LE weakness/buckling  Stairs            Wheelchair Mobility    Modified Rankin (Stroke Patients Only)       Balance Overall balance assessment: Needs assistance Sitting-balance support: Bilateral upper extremity supported;Feet supported Sitting balance-Leahy Scale: Poor Sitting balance - Comments: requires UE support in sitting for balance     Standing balance-Leahy Scale: Poor Standing balance comment: requires UE support on walker for static standing  Pertinent Vitals/Pain Pain Assessment: 0-10 Pain Score: 5  Pain Location: abdominal pain Pain Descriptors / Indicators: Constant;Discomfort Pain Intervention(s): Limited activity within patient's tolerance;Monitored during session;Premedicated before session;Repositioned  Vitals (HR and O2 on  room air) stable and WFL throughout treatment session.    Home Living Family/patient expects to be discharged to:: Private residence Living Arrangements: Children (Pt's daughter) Available Help at Discharge: Family Type of Home: House Home Access: [place]: One [place]: Environmental consultant - 2 wheels;Other (comment);Walker - 4 wheels;Tub bench;Grab bars - tub/shower (scooter)      Prior Function Level of Independence: Needs assistance   Gait / Transfers Assistance Needed: Assist for bed mobility.  Ambulates short distances in home with RW modified independently.  Uses scooter in community.  ADL's / Homemaking Assistance Needed: Has BSC on each side of bed.  Comments: Daughter works as a Teacher, early years/pre.     Hand Dominance        Extremity/Trunk Assessment   Upper Extremity Assessment Upper Extremity Assessment: Generalized weakness    Lower Extremity Assessment Lower Extremity Assessment: RLE deficits/detail;LLE deficits/detail RLE Deficits / Details: DF/PF 0/5; knee flexion/extension at least 3/5 AROM; hip flexion at least 3/5 AROM LLE Deficits / Details: DF/PF 1/5; knee flexion/extension 2-/5; hip flexion 2/5    Cervical / Trunk Assessment Cervical / Trunk Assessment: Normal  Communication   Communication: No difficulties  Cognition Arousal/Alertness: Awake/alert Behavior During Therapy: WFL for tasks assessed/performed Overall Cognitive Status: Within Functional Limits for tasks assessed                                        General Comments   Nursing cleared pt for participation in physical therapy.  Pt agreeable to PT session with encouragement of family/friends in room (visitors left for session).    Exercises  Transfer training.   Assessment/Plan    PT Assessment Patient needs continued PT services  PT Problem List Decreased strength;Decreased activity tolerance;Decreased balance;Decreased mobility;Pain        PT Treatment Interventions DME instruction;Gait training;Functional mobility training;Therapeutic activities;Therapeutic exercise;Balance training;Patient/family education    PT Goals (Current goals can be found in the Care Plan section)  Acute Rehab PT Goals Patient Stated Goal: to improve independence with functional mobility PT Goal Formulation: With patient Time For Goal Achievement: 11/06/16 Potential to Achieve Goals: Good    Frequency Min 2X/week   Barriers to discharge Decreased caregiver support      Co-evaluation               AM-PAC PT "6 Clicks" Daily Activity  Outcome Measure Difficulty turning over in bed (including adjusting bedclothes, sheets and blankets)?: Unable Difficulty moving from lying on back to sitting on the side of the bed? : Unable Difficulty sitting down on and standing up from a chair with arms (e.g., wheelchair, bedside commode, etc,.)?: Unable Help needed moving to and from a bed to chair (including a wheelchair)?: A Lot Help needed walking in hospital room?: Total Help needed climbing 3-5 steps with a railing? : Total 6 Click Score: 7    End of Session Equipment Utilized During Treatment: Gait belt Activity Tolerance: Patient limited by fatigue Patient left: in bed;with call bell/phone within reach;with bed alarm set;with family/visitor present (B heels elevated via pillow) Nurse Communication: Mobility status;Precautions PT Visit Diagnosis: Other abnormalities of gait and mobility (R26.89);Muscle weakness (  generalized) (M62.81);History of falling (Z91.81)    Time: 1657-9038 PT Time Calculation (min) (ACUTE ONLY): 47 min   Charges:   PT Evaluation $PT Eval Low Complexity: 1 Low PT Treatments $Therapeutic Activity: 23-37 mins   PT G Codes:   PT G-Codes **NOT FOR INPATIENT CLASS** Functional Assessment Tool Used: AM-PAC 6 Clicks Basic Mobility Functional Limitation: Mobility: Walking and moving around Mobility: Walking and Moving  Around Current Status (B3383): At least 80 percent but less than 100 percent impaired, limited or restricted Mobility: Walking and Moving Around Goal Status [phone]): At least 20 percent but less than 40 percent impaired, limited or restricted    Leitha Bleak, PT 10/23/16, 2:34 PM [phone]

## 2016-10-24 LAB — BASIC METABOLIC PANEL
Anion gap: 6 (ref 5–15)
BUN: 12 mg/dL (ref 6–20)
CALCIUM: 8.1 mg/dL — AB (ref 8.9–10.3)
CO2: 25 mmol/L (ref 22–32)
CREATININE: 2.08 mg/dL — AB (ref 0.44–1.00)
Chloride: 112 mmol/L — ABNORMAL HIGH (ref 101–111)
GFR, EST AFRICAN AMERICAN: 24 mL/min — AB (ref >60)
GFR, EST NON AFRICAN AMERICAN: 21 mL/min — AB (ref >60)
Glucose, Bld: 117 mg/dL — ABNORMAL HIGH (ref 65–99)
Potassium: 3.5 mmol/L (ref 3.5–5.1)
Sodium: 143 mmol/L (ref 135–145)

## 2016-10-24 MED ORDER — METRONIDAZOLE 500 MG PO TABS: 500.0000 mg | ORAL_TABLET | Freq: Three times a day (TID) | ORAL | 0 refills | Status: AC

## 2016-10-24 MED ORDER — CIPROFLOXACIN HCL 500 MG PO TABS: 500.0000 mg | ORAL_TABLET | Freq: Two times a day (BID) | ORAL | 0 refills | Status: AC

## 2016-10-24 MED ORDER — ENSURE ENLIVE PO LIQD
237.0000 mL | Freq: Two times a day (BID) | ORAL | Status: DC
  Administered 2016-10-25: 237 mL via ORAL

## 2016-10-24 NOTE — Progress Notes (Signed)
Physical Therapy Treatment Patient Details Name: Loretta Johns MRN: [id number] DOB: 1931-05-06 Today's Date: 10/24/2016    History of Present Illness Pt is an 83 y.o. female presenting to hospital with weakness and abdominal pain.  Pt recently seen in ER about 1 week prior and diagnosed with diverticulitis (pt unable to take antibiotic pills secondary to size though) and presented back to ED with worsening abdominal pain and nausea.  Pt admitted to hospital with acute diverticulitis, GERD, and acute renal insufficiency.  PMH includes breast CA, chronic back pain, high BP, IBS, polio, dementia, and tiny hiatal hernia.    PT Comments    Attempted transfer bed to recliner multiple times but unable to perform with 1 assist (pt assisted safely back onto bed each trial).  Pt demonstrating weakness and difficulty following commands.  Pt's daughter present entire session and reporting pt much weaker than baseline and did not feel she could physically assist pt at home.  Continue to recommend STR (CM and nursing notified).    Follow Up Recommendations  SNF     Equipment Recommendations  Rolling walker with 5" wheels    Recommendations for Other Services OT consult     Precautions / Restrictions Precautions Precautions: Fall Restrictions Weight Bearing Restrictions: No    Mobility  Bed Mobility Overal bed mobility: Needs Assistance Bed Mobility: Supine to Sit;Sit to Supine     Supine to sit: Mod assist;Max assist;HOB elevated Sit to supine: Max assist;HOB elevated   General bed mobility comments: assist for LE's and trunk supine to/from sit; increased effort to perform; 2 assist to boost pt up in bed  Transfers Overall transfer level: Needs assistance Equipment used: None Transfers: Stand Pivot Transfers Sit to Stand: Max assist         General transfer comment: trialed stand pivot bed to bedside chair x 5 attempts but unable to safely transfer pt to chair d/t pt's B  LE's either buckling (requiring B knees blocking) and/or pt with difficulty following directions complicating transfer  Ambulation/Gait             General Gait Details: not appropriate at this time d/t LE weakness/buckling   Stairs            Wheelchair Mobility    Modified Rankin (Stroke Patients Only)       Balance Overall balance assessment: Needs assistance Sitting-balance support: Bilateral upper extremity supported;Feet supported Sitting balance-Leahy Scale: Poor Sitting balance - Comments: pt initially with posterior lean requiring min to mod assist but with repositioning able to improve to close SBA                                    Cognition Arousal/Alertness: Awake/alert Behavior During Therapy: WFL for tasks assessed/performed Overall Cognitive Status: Within Functional Limits for tasks assessed                                        Exercises      General Comments General comments (skin integrity, edema, etc.): Pt's daughter present during session.   Pt agreeable to PT session.      Pertinent Vitals/Pain Pain Assessment: No/denies pain    Home Living                      Prior Function  PT Goals (current goals can now be found in the care plan section) Acute Rehab PT Goals Patient Stated Goal: to improve independence with functional mobility PT Goal Formulation: With patient Time For Goal Achievement: 11/06/16 Potential to Achieve Goals: Fair Progress towards PT goals: Progressing toward goals    Frequency    Min 2X/week      PT Plan Current plan remains appropriate    Co-evaluation              AM-PAC PT "6 Clicks" Daily Activity  Outcome Measure  Difficulty turning over in bed (including adjusting bedclothes, sheets and blankets)?: Unable Difficulty moving from lying on back to sitting on the side of the bed? : Unable Difficulty sitting down on and standing up from  a chair with arms (e.g., wheelchair, bedside commode, etc,.)?: Unable Help needed moving to and from a bed to chair (including a wheelchair)?: Total Help needed walking in hospital room?: Total Help needed climbing 3-5 steps with a railing? : Total 6 Click Score: 6    End of Session Equipment Utilized During Treatment: Gait belt Activity Tolerance: Patient limited by fatigue Patient left: in bed;with call bell/phone within reach;with bed alarm set;with family/visitor present (B heels elevated via pillow) Nurse Communication: Mobility status;Precautions PT Visit Diagnosis: Other abnormalities of gait and mobility (R26.89);Muscle weakness (generalized) (M62.81);History of falling (Z91.81)     Time: 1497-0263 PT Time Calculation (min) (ACUTE ONLY): 30 min  Charges:  $Therapeutic Activity: 23-37 mins                    G CodesLeitha Bleak, PT 10/24/16, 1:25 PM [phone]

## 2016-10-24 NOTE — Clinical Social Work Note (Signed)
CSW presented bed offers to patient and her family.  They would like either [facility] or [facility] [place].  CSW contacted SNFs awaiting for call back.  CSW faxed required clinical information to insurance company to begin authorization.  CSW to continue to follow patient's progress throughout discharge planning.  Jones Broom. [place], MSW, [place]  10/24/2016 6:12 PM

## 2016-10-24 NOTE — Progress Notes (Signed)
[facility] NAME: Loretta Johns    MR#:  [id number]  DATE OF BIRTH:  April 18, 1931  SUBJECTIVE:  CHIEF COMPLAINT:   Chief Complaint  Patient presents with  . Abdominal Pain   Poor historian. Pleasantly confused. Daughter at bedside  Still has diarrhea and weakness  REVIEW OF SYSTEMS:    Review of Systems  Unable to perform ROS: Dementia   DRUG ALLERGIES:   Allergies  Allergen Reactions  . [place] Grass Extract Itching  . Chocolate Flavor Other (See Comments)    Reaction: unknown  . Midazolam Other (See Comments)    Reaction: trouble waking up  . Pravastatin Other (See Comments)    WAS UNABLE TO WALK PER PT.  Marland Kitchen Propoxyphene Napsylate [Propoxyphene] Other (See Comments)    Darvocet-N 100 Reaction: unknown  . Reglan [Metoclopramide] Other (See Comments)    Reaction: felt like tongue was "drawing up"   VITALS:  Blood pressure (!) 109/54, pulse 84, temperature 98.8 F (37.1 C), temperature source Oral, resp. rate 18, height 5\' 2"  (1.575 m), weight 69.9 kg (154 lb), SpO2 95 %.  PHYSICAL EXAMINATION:   Physical Exam  GENERAL:  83 y.o.-year-old patient lying in the bed with no acute distress.  EYES: Pupils equal, round, reactive to light and accommodation. No scleral icterus. Extraocular muscles intact.  HEENT: Head atraumatic, normocephalic. Oropharynx and nasopharynx clear.  NECK:  Supple, no jugular venous distention. No thyroid enlargement, no tenderness.  LUNGS: Normal breath sounds bilaterally, no wheezing, rales, rhonchi. No use of accessory muscles of respiration.  CARDIOVASCULAR: S1, S2 normal. No murmurs, rubs, or gallops.  ABDOMEN: Soft, nondistended. Bowel sounds present. No organomegaly or mass. Lower abdominal tenderness EXTREMITIES: No cyanosis, clubbing or edema b/l.    NEUROLOGIC: Cranial nerves II through XII are intact. No focal Motor or sensory deficits b/l.   PSYCHIATRIC: The patient is alert and  awake. SKIN: No obvious rash, lesion, or ulcer.   LABORATORY PANEL:   CBC  Recent Labs Lab 10/23/16 0408  WBC 5.0  HGB 11.9*  HCT 34.3*  PLT 187   ------------------------------------------------------------------------------------------------------------------ Chemistries   Recent Labs Lab 10/22/16 1009  10/24/16 0731  NA 139  < > 143  K 3.2*  < > 3.5  CL 102  < > 112*  CO2 27  < > 25  GLUCOSE 122*  < > 117*  BUN 16  < > 12  CREATININE 2.06*  < > 2.08*  CALCIUM 8.8*  < > 8.1*  AST 36  --   --   ALT 17  --   --   ALKPHOS 59  --   --   BILITOT 0.7  --   --   < > = values in this interval not displayed. ------------------------------------------------------------------------------------------------------------------  Cardiac Enzymes No results for input(s): TROPONINI in the last 168 hours. ------------------------------------------------------------------------------------------------------------------  RADIOLOGY:  Ct Abdomen Pelvis Wo Contrast  Result Date: 10/22/2016 CLINICAL DATA:  Followup of diverticulitis. Abdominal pain with fevers. EXAM: CT ABDOMEN AND PELVIS WITHOUT CONTRAST TECHNIQUE: Multidetector CT imaging of the abdomen and pelvis was performed following the standard protocol without IV contrast. COMPARISON:  10/15/2016 FINDINGS: Lower chest: Bibasilar scarring or atelectasis. Moderate cardiomegaly, without pericardial effusion. Tiny hiatal hernia. Hepatobiliary: Right hemidiaphragm elevation. Normal liver. Cholecystectomy, without biliary ductal dilatation. Pancreas: Pancreatic atrophy is moderate. No duct dilatation or dominant mass. Spleen: Normal in size, without focal abnormality. Adrenals/Urinary Tract: Normal right adrenal gland. Minimal left adrenal nodularity. No renal  calculi or hydronephrosis. No hydroureter or ureteric calculi. No bladder calculi. Stomach/Bowel: Normal remainder of the stomach. Extensive colonic diverticulosis. Pericolonic  inflammation surrounding the sigmoid is mild, slightly improved. A separate area of proximal descending pericolonic edema is new, including on image 37/series 2. No drainable fluid collection or free extracolonic air. Soft tissue fullness just inferior to the ileocecal valve on image 42/series 2 is new since the prior. Normal terminal ileum and appendix. Normal small bowel. Vascular/Lymphatic: Aortic and branch vessel atherosclerosis. No abdominopelvic adenopathy. Reproductive: Hysterectomy.  No adnexal mass. Other: Trace pelvic fluid is similar. Mild pelvic floor laxity. Question packing material about the anterior pelvis and labia on image 88/ series 2. Musculoskeletal: Osteopenia. IMPRESSION: 1. Improved mild sigmoid diverticulitis. New proximal descending mild diverticulitis. 2.  Aortic Atherosclerosis (ICD10-I70.0). Electronically Signed   By: Abigail Miyamoto M.D.   On: 10/22/2016 13:58   ASSESSMENT AND PLAN:   Loretta Johns  is a 83 y.o. female with a known history of chronic low back pain, GERD, hypothyroidism, IBS, dementia, hypertension and interstitial cystitis presents to hospital secondary to worsening abdominal pain and nausea.  * Acute sigmoid diverticulitis We'll advance to regular diet. Continue IV antibiotics. Stop IVF today Still has diarrhea No pain.  * GERD. On Protonix.  * Acute kidney injury Stop ivf today  * Paroxysmal atrial fibrillation On Toprol and Xarelto.  * Hypothyroidism on Synthroid  * Dementia. Monitor for inpatient delirium  * Hypokalemia replaced  * DVT prophylaxis on Xarelto  All the records are reviewed and case discussed with Care Management/Social Worker Management plans discussed with the patient, family and they are in agreement.  CODE STATUS: FULL CODE  DVT Prophylaxis: SCDs  TOTAL TIME TAKING CARE OF THIS PATIENT: 35 minutes.   POSSIBLE D/C IN 1-2 DAYS, DEPENDING ON CLINICAL CONDITION.  Hillary Bow R M.D on 10/24/2016 at  12:28 PM  Between 7am to 6pm - Pager - [phone]  After 6pm go to [email or web address] - password [email or web address] [facility]  Office  [phone]  CC: Primary care physician; [facility]  Note: This dictation was prepared with Dragon dictation along with smaller phrase technology. Any transcriptional errors that result from this process are unintentional.

## 2016-10-24 NOTE — NC FL2 (Signed)
[place] LEVEL OF CARE SCREENING TOOL     IDENTIFICATION  Patient Name: Loretta Johns Birthdate: November 08, 1931 Sex: female Admission Date (Current Location): 10/22/2016  [place] and [place] Number:  Engineering geologist and Address:  [facility], [address] [place], [place] [address]      Provider Number: [id number]  Attending Physician Name and Address:  Hillary Bow, MD  Relative Name and Phone Number:  Delane Ginger Daughter   [phone]     Current Level of Care: Hospital Recommended Level of Care: [place] Prior Approval Number:    Date Approved/Denied:   PASRR Number: [id number]  Discharge Plan: SNF    Current Diagnoses: Patient Active Problem List   Diagnosis Date Noted  . Acute diverticulitis 10/22/2016  . Dementia, vascular, mixed, with behavioral disturbance 06/05/2016  . Splenic infarct 01/27/2016  . Acquired hypothyroidism 08/17/2014  . Essential (primary) hypertension 08/17/2014  . Suprapubic pain 08/17/2014  . History of renal stone 08/17/2014  . Blood glucose elevated 07/24/2014  . Drug intolerance 07/23/2014  . Atrial fibrillation with rapid ventricular response ([place]) 07/31/2013  . AF (paroxysmal atrial fibrillation) ([place]) 07/31/2013  . Acid reflux 07/21/2013  . Carotid artery narrowing 06/18/2013  . H/O transient cerebral ischemia 06/18/2013  . Accumulation of fluid in tissues 08/12/2012  . Hypercholesterolemia without hypertriglyceridemia 07/21/2012  . Cannot sleep 04/18/2011  . Post poliomyelitis syndrome 04/18/2011    Orientation RESPIRATION BLADDER Height & Weight     Self, Place  Normal Incontinent Weight: 154 lb (69.9 kg) Height:  5\' 2"  (157.5 cm)  BEHAVIORAL SYMPTOMS/MOOD NEUROLOGICAL BOWEL NUTRITION STATUS      Incontinent Diet (Mechanical Soft)  AMBULATORY STATUS COMMUNICATION OF NEEDS Skin   Limited Assist Verbally Normal                        Personal Care Assistance Level of Assistance  Bathing, Feeding, Dressing Bathing Assistance: Limited assistance Feeding assistance: Independent Dressing Assistance: Limited assistance     Functional Limitations Info  Sight, Hearing, Speech Sight Info: Adequate Hearing Info: Adequate Speech Info: Adequate    SPECIAL CARE FACTORS FREQUENCY  PT (By licensed PT), OT (By licensed OT)     PT Frequency: 5x a week OT Frequency: 5x a week            Contractures Contractures Info: Not present    Additional Factors Info  Code Status, Allergies, Psychotropic Code Status Info: Full Code Allergies Info: [place] GRASS EXTRACT, CHOCOLATE FLAVOR, MIDAZOLAM, PRAVASTATIN, PROPOXYPHENE NAPSYLATE PROPOXYPHENE, REGLAN METOCLOPRAMIDE  Psychotropic Info: risperiDONE (RISPERDAL) tablet 0.5 mg         Current Medications (10/24/2016):  This is the current hospital active medication list Current Facility-Administered Medications  Medication Dose Route Frequency Provider Last Rate Last Dose  . acetaminophen (TYLENOL) tablet 650 mg  650 mg Oral Q6H PRN Gladstone Lighter, MD       Or  . acetaminophen (TYLENOL) suppository 650 mg  650 mg Rectal Q6H PRN Gladstone Lighter, MD      . ciprofloxacin (CIPRO) IVPB 400 mg  400 mg Intravenous Q24H Gladstone Lighter, MD   Stopped at 10/23/16 1844  . dicyclomine (BENTYL) capsule 10 mg  10 mg Oral TID AC & HS Gladstone Lighter, MD   10 mg at 10/24/16 1030  . HYDROcodone-acetaminophen (NORCO/VICODIN) 5-325 MG per tablet 1 tablet  1 tablet Oral Q6H PRN Gladstone Lighter, MD   1 tablet at 10/24/16 0215  .  levothyroxine (SYNTHROID, LEVOTHROID) tablet 50 mcg  50 mcg Oral QAC breakfast Gladstone Lighter, MD   50 mcg at 10/24/16 1030  . metoprolol succinate (TOPROL-XL) 24 hr tablet 12.5 mg  12.5 mg Oral Daily Gladstone Lighter, MD   12.5 mg at 10/23/16 0907  . metroNIDAZOLE (FLAGYL) IVPB 500 mg  500 mg Intravenous Q8H Gladstone Lighter, MD   Stopped at  10/24/16 1132  . ondansetron (ZOFRAN) tablet 4 mg  4 mg Oral Q6H PRN Gladstone Lighter, MD       Or  . ondansetron (ZOFRAN) injection 4 mg  4 mg Intravenous Q6H PRN Gladstone Lighter, MD   4 mg at 10/23/16 1845  . pantoprazole (PROTONIX) EC tablet 40 mg  40 mg Oral BID AC Gladstone Lighter, MD   40 mg at 10/24/16 1027  . risperiDONE (RISPERDAL) tablet 0.5 mg  0.5 mg Oral BID Gladstone Lighter, MD   0.5 mg at 10/24/16 1031  . Rivaroxaban (XARELTO) tablet 15 mg  15 mg Oral Q breakfast Gladstone Lighter, MD   15 mg at 10/24/16 1031     Discharge Medications: Please see discharge summary for a list of discharge medications.  Relevant Imaging Results:  Relevant Lab Results:   Additional Information SSN [id number]  Ross Ludwig, [place]

## 2016-10-24 NOTE — Evaluation (Signed)
Occupational Therapy Evaluation Patient Details Name: Loretta Johns MRN: [id number] DOB: September 12, 1931 Today's Date: 10/24/2016    History of Present Illness Pt is an 83 y.o. female presenting to hospital with weakness and abdominal pain.  Pt recently seen in ER about 1 week prior and diagnosed with diverticulitis (pt unable to take antibiotic pills secondary to size though) and presented back to ED with worsening abdominal pain and nausea.  Pt admitted to hospital with acute diverticulitis, GERD, and acute renal insufficiency.  PMH includes breast CA, chronic back pain, high BP, IBS, polio, dementia, and tiny hiatal hernia.   Clinical Impression   Pt seen for OT evaluation this date. Prior to hospital admission, pt was ambulatory with a RW for short distances, using a scooter in the home and in community. Pt lives with her daughter, requiring assist recently for self care tasks, [facility] on each side of bed.  Currently pt is very limited by poor activity tolerance, decreased strength, abdominal pain, and increased confusion which is significantly impacting her ability to participate in self care tasks. Daughter present for duration of session, noted pt has been increasingly getting "chocked up" on any kinds of foods. MD entered room briefly to assess, notified of daughter's complaint of choking. Pt may benefit from a speech therapy consult to more formally assess swallowing. Pt would benefit from skilled OT to address noted impairments and functional limitations (see below for any additional details) in order to maximize return to PLOF and minimize risk of future falls.  Upon hospital discharge, recommend pt discharge to STR prior to safe return back home with daughter.    Follow Up Recommendations  SNF    Equipment Recommendations  None recommended by OT    Recommendations for Other Services       Precautions / Restrictions Precautions Precautions: Fall Restrictions Weight Bearing  Restrictions: No      Mobility Bed Mobility   General bed mobility comments: pt too fatigued to participate, continue to assess next session  Transfers         General transfer comment: pt too fatigued to participate, continue to assess next session    Balance                                 ADL either performed or assessed with clinical judgement   ADL Overall ADL's : Needs assistance/impaired Eating/Feeding: Bed level;Set up   Grooming: Bed level;Set up   Upper Body Bathing: Maximal assistance;Bed level;Moderate assistance   Lower Body Bathing: Maximal assistance;Bed level   Upper Body Dressing : Moderate assistance;Bed level   Lower Body Dressing: Maximal assistance;Bed level     Toilet Transfer Details (indicate cue type and reason): deferred due to safety/pt weakness           General ADL Comments: pt generally mod-max assist for ADL tasks including bathing, dressing      Vision Baseline Vision/History: Wears glasses Wears Glasses: Reading only Patient Visual Report: No change from baseline Vision Assessment?: No apparent visual deficits     Perception     Praxis      Pertinent Vitals/Pain Pain Assessment: 0-10 Pain Score: 7  Pain Location: abdominal pain Pain Descriptors / Indicators: Constant;Discomfort;Grimacing Pain Intervention(s): Limited activity within patient's tolerance;Monitored during session     Hand Dominance Right   Extremity/Trunk Assessment Upper Extremity Assessment Upper Extremity Assessment: Generalized weakness;Difficult to assess due to impaired cognition (grossly 3/5 bilaterally,  fair- grip strength, unable to fully assess due to pain in R shoulder from pneumonia shot per dtr.)   Lower Extremity Assessment Lower Extremity Assessment: Defer to PT evaluation;RLE deficits/detail;LLE deficits/detail   Cervical / Trunk Assessment Cervical / Trunk Assessment: Normal   Communication  Communication Communication: No difficulties   Cognition Arousal/Alertness: Lethargic Behavior During Therapy: WFL for tasks assessed/performed Overall Cognitive Status: History of cognitive impairments - at baseline                                 General Comments: pt sleepy, would open eyes and respond to brief questions appropriately. Daughter in room, states pt did not sleep well at all last night, very tired today and a little more confused.   General Comments  Pt's daughter present for duration of session.     Exercises     Shoulder Instructions      Home Living Family/patient expects to be discharged to:: Private residence Living Arrangements: Children (pt lives with daughter) Available Help at Discharge: Family;Available PRN/intermittently (daughter works as a Recruitment consultant) Type of Home: [facility] Access: [place]: One level     Bathroom Shower/Tub: Independence unit   [place]: Environmental consultant - 2 wheels;Other (comment);Walker - 4 wheels;Tub bench;Grab bars - tub/shower;Bedside commode (scooter, lift chair (broken right now))          Prior Functioning/Environment Level of Independence: Needs assistance  Gait / Transfers Assistance Needed: Assist for bed mobility.  Ambulates short distances in home with RW modified independently.  Uses scooter in home and in community. ADL's / Homemaking Assistance Needed: Has BSC on each side of bed.   Comments: Daughter works as a Teacher, early years/pre.        OT Problem List: Pain;Decreased strength;Decreased activity tolerance;Decreased safety awareness;Decreased cognition      OT Treatment/Interventions: Self-care/ADL training;Therapeutic exercise;Therapeutic activities;DME and/or AE instruction;Patient/family education    OT Goals(Current goals can be found in the care plan section) Acute Rehab OT Goals Patient Stated Goal: to improve independence  with functional mobility OT Goal Formulation: With patient/family Time For Goal Achievement: 11/07/16 Potential to Achieve Goals: Good  OT Frequency: Min 1X/week   Barriers to D/C: Decreased caregiver support          Co-evaluation              AM-PAC PT "6 Clicks" Daily Activity     Outcome Measure Help from another person eating meals?: A Little Help from another person taking care of personal grooming?: A Little Help from another person toileting, which includes using toliet, bedpan, or urinal?: Total Help from another person bathing (including washing, rinsing, drying)?: A Lot Help from another person to put on and taking off regular upper body clothing?: A Lot Help from another person to put on and taking off regular lower body clothing?: A Lot 6 Click Score: 13   End of Session    Activity Tolerance: Patient limited by fatigue;Patient limited by pain Patient left: in bed;with call bell/phone within reach;with bed alarm set;with family/visitor present  OT Visit Diagnosis: Other abnormalities of gait and mobility (R26.89);Muscle weakness (generalized) (M62.81);Pain Pain - part of body:  (abdominal)                Time: 3235-5732 OT Time Calculation (min): 14 min Charges:  OT General Charges $OT Visit:  1 Visit OT Evaluation $OT Eval Moderate Complexity: 1 Mod G-Codes: OT G-codes **NOT FOR INPATIENT CLASS** Functional Assessment Tool Used: AM-PAC 6 Clicks Daily Activity;Clinical judgement Functional Limitation: Self care Self Care Current Status (G1829): At least 80 percent but less than 100 percent impaired, limited or restricted Self Care Goal Status (H3716): At least 40 percent but less than 60 percent impaired, limited or restricted   Jeni Salles, MPH, MS, OTR/L ascom [phone] 10/24/16, 2:20 PM

## 2016-10-24 NOTE — Discharge Instructions (Addendum)
Resume diet and activity as before  Diet at discharge recommendation:  Mech Soft/regular w/ moist, cut meats; Thin liquids.  General Aspiration precautions.  Pills in Puree - CRUSHED as able. Support at meals.

## 2016-10-24 NOTE — Care Management Note (Signed)
Case Management Note  Patient Details  Name: Loretta Johns MRN: [id number] Date of Birth: Nov 11, 1931  Subjective/Objective:      Admitted to [facility] with the diagnosis of acute diverticulitis. Lives with family. Goes to [employer] in [place]. Full liquid diet              Action/Plan: Physical therapy evaluation completd. Recommending skilled nursing facility. Clinical Social Worker will assist with this plan   Expected Discharge Date:                  Expected Discharge Plan:     In-House Referral:     Discharge planning Services     Post Acute Care Choice:    Choice offered to:     DME Arranged:    DME Agency:     HH Arranged:    HH Agency:     Status of Service:     If discussed at [employer], dates discussed:    Additional Comments:  Shelbie Ammons, RN MSN CCM Care Management [phone] 10/24/2016, 6:51 AM

## 2016-10-25 LAB — BASIC METABOLIC PANEL
ANION GAP: 5 (ref 5–15)
BUN: 13 mg/dL (ref 6–20)
CO2: 27 mmol/L (ref 22–32)
CREATININE: 2.14 mg/dL — AB (ref 0.44–1.00)
Calcium: 8.3 mg/dL — ABNORMAL LOW (ref 8.9–10.3)
Chloride: 109 mmol/L (ref 101–111)
GFR calc Af Amer: 23 mL/min — ABNORMAL LOW (ref >60)
GFR calc non Af Amer: 20 mL/min — ABNORMAL LOW (ref >60)
GLUCOSE: 110 mg/dL — AB (ref 65–99)
Potassium: 3.4 mmol/L — ABNORMAL LOW (ref 3.5–5.1)
Sodium: 141 mmol/L (ref 135–145)

## 2016-10-25 MED ORDER — POTASSIUM CHLORIDE IN NACL 20-0.45 MEQ/L-% IV SOLN
INTRAVENOUS | Status: DC
Start: 2016-10-25 — End: 2016-10-26
  Administered 2016-10-25 (×2): via INTRAVENOUS
  Filled 2016-10-25 (×3): qty 1000

## 2016-10-25 NOTE — Progress Notes (Signed)
Physical Therapy Treatment Patient Details Name: Loretta Johns MRN: [id number] DOB: 10/17/1931 Today's Date: 10/25/2016    History of Present Illness Pt is an 83 y.o. female presenting to hospital with weakness and abdominal pain.  Pt recently seen in ER about 1 week prior and diagnosed with diverticulitis (pt unable to take antibiotic pills secondary to size though) and presented back to ED with worsening abdominal pain and nausea.  Pt admitted to hospital with acute diverticulitis, GERD, and acute renal insufficiency.  PMH includes breast CA, chronic back pain, high BP, IBS, polio, dementia, and tiny hiatal hernia.    PT Comments    Pt reporting being fatigued from tough night (was awake a lot with diarrhea) and declining OOB mobility today but was agreeable to in bed ex's.  Pt requiring pacing but tolerated ex's fairly well and pt appearing motivated to participate in ex's (pt reporting she felt better with and after performing ex's).  Will continue with strengthening and attempt OOB mobility next session as able.    Follow Up Recommendations  SNF     Equipment Recommendations  Rolling walker with 5" wheels    Recommendations for Other Services       Precautions / Restrictions Precautions Precautions: Fall Restrictions Weight Bearing Restrictions: No    Mobility  Bed Mobility               General bed mobility comments: Deferred per pt request (pt did not sleep well last night and was fatigued)  Transfers                 General transfer comment: Deferred per pt request (pt did not sleep well last night and was fatigued)  Ambulation/Gait             General Gait Details: Deferred per pt request (pt did not sleep well last night and was fatigued)   Stairs            Wheelchair Mobility    Modified Rankin (Stroke Patients Only)       Balance                                            Cognition  Arousal/Alertness: Awake/alert   Overall Cognitive Status: History of cognitive impairments - at baseline                                        Exercises Total Joint Exercises Ankle Circles/Pumps: AAROM;Strengthening;Both;Supine (2 sets of 10 B) Quad Sets: AROM;Strengthening;Both;Supine (2 sets of 10 B) Short Arc Quad: AAROM;Strengthening;Both;Supine (2 sets of 10 B) Heel Slides: AAROM;Strengthening;Both;Supine (3 sets of 10 B) Hip ABduction/ADduction: AAROM;Strengthening;Both;Supine (2 sets of 10 B)  Pt requiring vc's for technique of above ex's.    General Comments General comments (skin integrity, edema, etc.): Pt's daughter present beginning of session but left during session.  Pt and pt's daughter agreeable to PT session.      Pertinent Vitals/Pain Pain Assessment: No/denies pain    Home Living                      Prior Function            PT Goals (current goals can now be found in the care plan section)  Acute Rehab PT Goals Patient Stated Goal: to improve independence with functional mobility PT Goal Formulation: With patient Time For Goal Achievement: 11/06/16 Potential to Achieve Goals: Fair Progress towards PT goals: Progressing toward goals    Frequency    Min 2X/week      PT Plan Current plan remains appropriate    Co-evaluation              AM-PAC PT "6 Clicks" Daily Activity  Outcome Measure  Difficulty turning over in bed (including adjusting bedclothes, sheets and blankets)?: Unable Difficulty moving from lying on back to sitting on the side of the bed? : Unable Difficulty sitting down on and standing up from a chair with arms (e.g., wheelchair, bedside commode, etc,.)?: Unable Help needed moving to and from a bed to chair (including a wheelchair)?: Total Help needed walking in hospital room?: Total Help needed climbing 3-5 steps with a railing? : Total 6 Click Score: 6    End of Session   Activity  Tolerance: Patient limited by fatigue Patient left: in bed;with call bell/phone within reach;with bed alarm set (B heels elevated via pillows) Nurse Communication: Mobility status;Precautions (Pt incontinent of urine and requiring clean-up) PT Visit Diagnosis: Other abnormalities of gait and mobility (R26.89);Muscle weakness (generalized) (M62.81);History of falling (Z91.81)     Time: 4854-6270 PT Time Calculation (min) (ACUTE ONLY): 38 min  Charges:  $Therapeutic Exercise: 38-52 mins                    G CodesLeitha Bleak, PT 10/25/16, 12:29 PM [phone]

## 2016-10-25 NOTE — Discharge Summary (Addendum)
[facility] [place] NAME: Loretta Johns    MR#:  [id number]  DATE OF BIRTH:  13-Sep-1931  DATE OF ADMISSION:  10/22/2016 ADMITTING PHYSICIAN: Gladstone Lighter, MD  DATE OF DISCHARGE: 10/26/2016  PRIMARY CARE PHYSICIAN: [facility]   ADMISSION DIAGNOSIS:  Diverticulitis [K57.92] Acute renal insufficiency [N28.9]  DISCHARGE DIAGNOSIS:  Active Problems:   Acute diverticulitis   SECONDARY DIAGNOSIS:   Past Medical History:  Diagnosis Date  . Breast cancer ([place])   . Cervicodynia   . Chronic back pain   . Chronic cystitis   . Dementia   . Dysphagia   . Esophageal reflux   . HBP (high blood pressure)   . History of augmentation mammoplasty   . History of renal stone   . Hypothyroidism   . IBS (irritable bowel syndrome)   . Incontinence   . Interstitial cystitis   . Microscopic hematuria   . Polio      ADMITTING HISTORY  HISTORY OF PRESENT ILLNESS:  Loretta Johns  is a 83 y.o. female with a known history of chronic low back pain, GERD, hypothyroidism, IBS, dementia, hypertension and interstitial cystitis presents to hospital secondary to worsening abdominal pain and nausea. Her symptoms started initially with left lower quadrant abdominal pain about a week ago. She was brought to the emergency room and was diagnosed with diverticulitis. She was discharged on oral Cipro and Flagyl. However due to the size of the pills, patient was unable to take her oral antibiotics. Initial couple of days she started to turn around. But for the last 2 days her weakness returned, she's been having chills, worsening nausea and vomiting. Couple of loose stools yesterday. In her abdominal pain got worse overnight. Repeat CT abdomen showing descending colon diverticulitis along with sigmoid diverticulitis. She is being admitted for the same.  HOSPITAL COURSE:   Loretta Johns a 83 y.o. femalewith a known history of chronic  low back pain, GERD, hypothyroidism, IBS, dementia, hypertension and interstitial cystitis presents to hospital secondary to worsening abdominal pain and nausea.  * Acute sigmoid diverticulitis regular diet. Continue IV antibiotics. Still has diarrhea No pain.  C. difficile and stool PCR negative PO flagyl and cipro after discharge  * GERD. On Protonix.  * Acute kidney injury Improving with IVF  * Paroxysmal atrial fibrillation On Toprol and Xarelto.  * Hypothyroidism on Synthroid  * Dementia. Monitor for inpatient delirium  * Hypokalemia replaced  * DVT prophylaxis on Xarelto  Diet at discharge recommendation:  Mech Soft/regular w/ moist, cut meats; Thin liquids.  General Aspiration precautions.  Pills in Puree - CRUSHED as able. Support at meals.   Stable for discharge to SNF  CONSULTS OBTAINED:    DRUG ALLERGIES:   Allergies  Allergen Reactions  . Guatemala Grass Extract Itching  . Chocolate Flavor Other (See Comments)    Reaction: unknown  . Midazolam Other (See Comments)    Reaction: trouble waking up  . Pravastatin Other (See Comments)    WAS UNABLE TO WALK PER PT.  Marland Kitchen Propoxyphene Napsylate [Propoxyphene] Other (See Comments)    Darvocet-N 100 Reaction: unknown  . Reglan [Metoclopramide] Other (See Comments)    Reaction: felt like tongue was "drawing up"    DISCHARGE MEDICATIONS:   Current Discharge Medication List    CONTINUE these medications which have CHANGED   Details  ciprofloxacin (CIPRO) 500 MG tablet Take 1 tablet (500 mg total) by mouth 2 (two) times daily. Qty:  14 tablet, Refills: 0    metroNIDAZOLE (FLAGYL) 500 MG tablet Take 1 tablet (500 mg total) by mouth 3 (three) times daily. Qty: 21 tablet, Refills: 0      CONTINUE these medications which have NOT CHANGED   Details  levothyroxine (SYNTHROID, LEVOTHROID) 50 MCG tablet Take 50 mcg by mouth daily before breakfast.     metoprolol succinate (TOPROL-XL) 25 MG 24 hr tablet  Take 12.5 mg by mouth daily.    risperiDONE (RISPERDAL) 0.25 MG tablet Take 1 tablet (0.25 mg total) by mouth 2 (two) times daily as needed (confusion or hallucinations). Qty: 60 tablet, Refills: 0    XARELTO 20 MG TABS tablet Take 20 mg by mouth daily with breakfast. Refills: 0    dicyclomine (BENTYL) 10 MG capsule Take 1 capsule (10 mg total) by mouth 4 (four) times daily -  before meals and at bedtime. Qty: 60 capsule, Refills: 0    hydrochlorothiazide (MICROZIDE) 12.5 MG capsule Take 12.5 mg by mouth daily.    HYDROcodone-acetaminophen (NORCO/VICODIN) 5-325 MG tablet Take 1 tablet by mouth every 6 (six) hours as needed.    hyoscyamine (LEVSIN, ANASPAZ) 0.125 MG tablet Take 0.125 mg by mouth every 4 (four) hours as needed for bladder spasms.     ondansetron (ZOFRAN) 4 MG tablet Take 1 tablet (4 mg total) by mouth every 6 (six) hours as needed for nausea. Qty: 20 tablet, Refills: 0      STOP taking these medications     apixaban (ELIQUIS) 5 MG TABS tablet      HYDROcodone-acetaminophen (NORCO) 10-325 MG tablet      traMADol (ULTRAM) 50 MG tablet         Today   VITAL SIGNS:  Blood pressure 126/85, pulse (!) 114, temperature 98.2 F (36.8 C), temperature source Oral, resp. rate 18, height 5\' 2"  (1.575 m), weight 69.9 kg (154 lb), SpO2 95 %.  I/O:   Intake/Output Summary (Last 24 hours) at 10/25/16 1216 Last data filed at 10/25/16 1014  Gross per 24 hour  Intake              240 ml  Output                0 ml  Net              240 ml    PHYSICAL EXAMINATION:  Physical Exam  GENERAL:  83 y.o.-year-old patient lying in the bed with no acute distress.  LUNGS: Normal breath sounds bilaterally, no wheezing, rales,rhonchi or crepitation. No use of accessory muscles of respiration.  CARDIOVASCULAR: S1, S2 normal. No murmurs, rubs, or gallops.  ABDOMEN: Soft, non-tender, non-distended. Bowel sounds present. No organomegaly or mass.  NEUROLOGIC: Moves all 4  extremities. PSYCHIATRIC: The patient is alert and awake. Pleasantly confused SKIN: No obvious rash, lesion, or ulcer.   DATA REVIEW:   CBC  Recent Labs Lab 10/26/16 0429  WBC 5.8  HGB 11.7*  HCT 33.8*  PLT 184    Chemistries   Recent Labs Lab 10/22/16 1009  10/26/16 0429  NA 139  < > 139  K 3.2*  < > 3.7  CL 102  < > 110  CO2 27  < > 25  GLUCOSE 122*  < > 102*  BUN 16  < > 12  CREATININE 2.06*  < > 1.85*  CALCIUM 8.8*  < > 8.2*  AST 36  --   --   ALT 17  --   --  ALKPHOS 59  --   --   BILITOT 0.7  --   --   < > = values in this interval not displayed.  Cardiac Enzymes No results for input(s): TROPONINI in the last 168 hours.  Microbiology Results  Results for orders placed or performed during the hospital encounter of 10/22/16  C difficile quick scan w PCR reflex     Status: None   Collection Time: 10/25/16  1:44 AM  Result Value Ref Range Status   C Diff antigen NEGATIVE NEGATIVE Final   C Diff toxin NEGATIVE NEGATIVE Final   C Diff interpretation No C. difficile detected.  Final  Gastrointestinal Panel by PCR , Stool     Status: None   Collection Time: 10/25/16  1:44 AM  Result Value Ref Range Status   Campylobacter species NOT DETECTED NOT DETECTED Final   Plesimonas shigelloides NOT DETECTED NOT DETECTED Final   Salmonella species NOT DETECTED NOT DETECTED Final   Yersinia enterocolitica NOT DETECTED NOT DETECTED Final   Vibrio species NOT DETECTED NOT DETECTED Final   Vibrio cholerae NOT DETECTED NOT DETECTED Final   Enteroaggregative E coli (EAEC) NOT DETECTED NOT DETECTED Final   Enteropathogenic E coli (EPEC) NOT DETECTED NOT DETECTED Final   Enterotoxigenic E coli (ETEC) NOT DETECTED NOT DETECTED Final   Shiga like toxin producing E coli (STEC) NOT DETECTED NOT DETECTED Final   Shigella/Enteroinvasive E coli (EIEC) NOT DETECTED NOT DETECTED Final   Cryptosporidium NOT DETECTED NOT DETECTED Final   Cyclospora cayetanensis NOT DETECTED NOT  DETECTED Final   Entamoeba histolytica NOT DETECTED NOT DETECTED Final   Giardia lamblia NOT DETECTED NOT DETECTED Final   Adenovirus F40/41 NOT DETECTED NOT DETECTED Final   Astrovirus NOT DETECTED NOT DETECTED Final   Norovirus GI/GII NOT DETECTED NOT DETECTED Final   Rotavirus A NOT DETECTED NOT DETECTED Final   Sapovirus (I, II, IV, and V) NOT DETECTED NOT DETECTED Final    RADIOLOGY:  No results found.  Follow up with PCP in 1 week.  Management plans discussed with the patient, family and they are in agreement.  CODE STATUS:     Code Status Orders        Start     Ordered   10/22/16 1600  Full code  Continuous     10/22/16 1559    Code Status History    Date Active Date Inactive Code Status Order ID Comments User Context   01/28/2016  1:36 AM 01/28/2016  8:21 PM Full Code [id number]  Lance Coon, MD Inpatient    Advance Directive Documentation     Most Recent Value  Type of Advance Directive  Healthcare Power of Attorney  Pre-existing out of facility DNR order (yellow form or pink MOST form)  -  "MOST" Form in Place?  -      TOTAL TIME TAKING CARE OF THIS PATIENT ON DAY OF DISCHARGE: more than 30 minutes.   Hillary Bow R M.D on 10/26/2016 at 8:57 AM  Between 7am to 6pm - Pager - [phone]  After 6pm go to [email or web address] - password [email or web address] [facility]  Office  [phone]  CC: Primary care physician; [facility]  Note: This dictation was prepared with Dragon dictation along with smaller phrase technology. Any transcriptional errors that result from this process are unintentional.

## 2016-10-25 NOTE — Care Management Important Message (Signed)
Important Message  Patient Details  Name: Loretta Johns MRN: [id number] Date of Birth: 08-21-1931   Medicare Important Message Given:  Yes    Shelbie Ammons, RN 10/25/2016, 8:21 AM

## 2016-10-25 NOTE — Progress Notes (Signed)
[facility] at [place] NAME: Loretta Johns    MR#:  [id number]  DATE OF BIRTH:  Oct 17, 1931  SUBJECTIVE:  CHIEF COMPLAINT:   Chief Complaint  Patient presents with  . Abdominal Pain   Poor historian. Pleasantly confused. Daughter at bedside  Still has diarrhea. Confused but conversational which is her baseline  REVIEW OF SYSTEMS:    Review of Systems  Unable to perform ROS: Dementia   DRUG ALLERGIES:   Allergies  Allergen Reactions  . Guatemala Grass Extract Itching  . Chocolate Flavor Other (See Comments)    Reaction: unknown  . Midazolam Other (See Comments)    Reaction: trouble waking up  . Pravastatin Other (See Comments)    WAS UNABLE TO WALK PER PT.  Marland Kitchen Propoxyphene Napsylate [Propoxyphene] Other (See Comments)    Darvocet-N 100 Reaction: unknown  . Reglan [Metoclopramide] Other (See Comments)    Reaction: felt like tongue was "drawing up"   VITALS:  Blood pressure 126/85, pulse (!) 114, temperature 98.2 F (36.8 C), temperature source Oral, resp. rate 18, height 5\' 2"  (1.575 m), weight 69.9 kg (154 lb), SpO2 95 %.  PHYSICAL EXAMINATION:   Physical Exam  GENERAL:  83 y.o.-year-old patient lying in the bed with no acute distress.  EYES: Pupils equal, round, reactive to light and accommodation. No scleral icterus. Extraocular muscles intact.  HEENT: Head atraumatic, normocephalic. Oropharynx and nasopharynx clear.  NECK:  Supple, no jugular venous distention. No thyroid enlargement, no tenderness.  LUNGS: Normal breath sounds bilaterally, no wheezing, rales, rhonchi. No use of accessory muscles of respiration.  CARDIOVASCULAR: S1, S2 normal. No murmurs, rubs, or gallops.  ABDOMEN: Soft, nondistended. Bowel sounds present. No organomegaly or mass. Lower abdominal tenderness EXTREMITIES: No cyanosis, clubbing or edema b/l.    NEUROLOGIC: Cranial nerves II through XII are intact. No focal Motor or sensory deficits b/l.    PSYCHIATRIC: The patient is alert and awake. Pleasantly confused SKIN: No obvious rash, lesion, or ulcer.   LABORATORY PANEL:   CBC  Recent Labs Lab 10/23/16 0408  WBC 5.0  HGB 11.9*  HCT 34.3*  PLT 187   ------------------------------------------------------------------------------------------------------------------ Chemistries   Recent Labs Lab 10/22/16 1009  10/25/16 0342  NA 139  < > 141  K 3.2*  < > 3.4*  CL 102  < > 109  CO2 27  < > 27  GLUCOSE 122*  < > 110*  BUN 16  < > 13  CREATININE 2.06*  < > 2.14*  CALCIUM 8.8*  < > 8.3*  AST 36  --   --   ALT 17  --   --   ALKPHOS 59  --   --   BILITOT 0.7  --   --   < > = values in this interval not displayed. ------------------------------------------------------------------------------------------------------------------  Cardiac Enzymes No results for input(s): TROPONINI in the last 168 hours. ------------------------------------------------------------------------------------------------------------------  RADIOLOGY:  No results found. ASSESSMENT AND PLAN:   [place] Loretta Johns  is a 83 y.o. female with a known history of chronic low back pain, GERD, hypothyroidism, IBS, dementia, hypertension and interstitial cystitis presents to hospital secondary to worsening abdominal pain and nausea.  * Acute sigmoid diverticulitis regular diet. Continue IV antibiotics. Still has diarrhea No pain. Check C diff and stool PCR  Did not check for c diff earlier cause patient uses senna at home. But continues to have diarrhea  * GERD. On Protonix.  * Acute kidney injury Worsening Start IVF.  Repeat labs in AM  * Paroxysmal atrial fibrillation On Toprol and Xarelto.  * Hypothyroidism on Synthroid  * Dementia. Monitor for inpatient delirium  * Hypokalemia replaced  * DVT prophylaxis on Xarelto  All the records are reviewed and case discussed with Care Management/Social Worker Management plans discussed  with the patient, family and they are in agreement.  CODE STATUS: FULL CODE  DVT Prophylaxis: SCDs  TOTAL TIME TAKING CARE OF THIS PATIENT: 35 minutes.   POSSIBLE D/C IN 1-2 DAYS, DEPENDING ON CLINICAL CONDITION.  Loretta Johns R M.D on 10/25/2016 at 12:11 PM  Between 7am to 6pm - Pager - [phone]  After 6pm go to [email or web address] - password [email or web address] [facility]  Office  [phone]  CC: Primary care physician; [facility]  Note: This dictation was prepared with Dragon dictation along with smaller phrase technology. Any transcriptional errors that result from this process are unintentional.

## 2016-10-25 NOTE — Clinical Social Work Note (Addendum)
CSW received phone call from [facility] they have approved patient to go to SNF tomorrow if she is medically ready for discharge and orders have been received.  Authorization number is [id number] Loma Boston is the care manager.  CSW updated [facility], and patient's daughter.  Jones Broom. Carney, MSW, [place]  10/25/2016 11:27 AM

## 2016-10-25 NOTE — Progress Notes (Signed)
  Speech Language Pathology Treatment: Dysphagia  Patient Details Name: Loretta Johns MRN: [id number] DOB: January 24, 1932 Today's Date: 10/25/2016 Time: 4098-1191 SLP Time Calculation (min) (ACUTE ONLY): 45 min  Assessment / Plan / Recommendation Clinical Impression  Pt was seen as a f/u post the initial evaluation earlier in week. Pt's diet has been upgrade to regular consistency now per MD, post improvement of the diverticulitis. Pt is eating some at meals but c/o feeling "full" quickly w/ any oral intake including pill swallowing (takes pills w/ puree for safer swallowing).  Thorough education w/ Daughter this session discussing topics of aspiration precautions, facilitation and support strategies to aid swallowing w/ meals (cutting meats small, moistening all foods, and reducing breads - all d/t the slower Esophagus and GI motility w/ advancing age). Also discussed pill swallowing: d/t declined Cognitive status and confusion at times w/ task, pt requires pills in Puree for safer swallowing. Daughter requested CRUSHING the pills for even further ease of swallowing and intake - reduces the "full" feelings w/ "al the pills"; SLP agreed fully. During the session, pt did consume sips of thin liquid w/ no immediate, overt s/s of aspiration noted.  Recommend a more cut/chopped diet of Dysphagia level 3(mech soft) w/ thin liquids, general aspiration precautions, Pills CRUSHED in Puree. Support and encouragement at meals. Dietitian recommended to Daughter to f/u w/ education on nutritional supplementing appropriate for pt (age).  No further skilled ST services indicated at this time. NSG to reconsult if indicated while admitted. Daughter agreed; NSG updated and agreed.   HPI HPI: Pt is a 83 y.o. female with a known history of chronic low back pain, GERD, hypothyroidism, IBS, dementia, hypertension and interstitial cystitis presents to hospital secondary to worsening abdominal pain and nausea.Her  symptoms started initially with left lower quadrant abdominal pain about a week ago. She was brought to the emergency room and was diagnosed with diverticulitis. She was discharged on oral Cipro and Flagyl. However due to the size of the pills, patient was unable to take her oral antibiotics. Initial couple of days she started to turn around. But for the last 2 days her weakness returned, she's been having chills, worsening nausea and vomiting. Couple of loose stools yesterday. In her abdominal pain got worse overnight. Repeat CT abdomen showing descending colon diverticulitis along with sigmoid diverticulitis. She is being admitted for the same.      SLP Plan  All goals met       Recommendations  Diet recommendations: Regular;Dysphagia 3 (mechanical soft);Thin liquid Liquids provided via: Cup;Straw (monitor any straw use) Medication Administration: Crushed with puree (this is the method Daughter is requesting at this time) Supervision: Patient able to self feed;Staff to assist with self feeding;Intermittent supervision to cue for compensatory strategies (encouragement) Compensations: Minimize environmental distractions;Slow rate;Small sips/bites;Lingual sweep for clearance of pocketing;Multiple dry swallows after each bite/sip;Follow solids with liquid Postural Changes and/or Swallow Maneuvers: Seated upright 90 degrees;Upright 30-60 min after meal                General recommendations:  (dietician f/u for nutritional support) Oral Care Recommendations: Oral care BID;Patient independent with oral care;Staff/trained caregiver to provide oral care Follow up Recommendations: None SLP Visit Diagnosis: Dysphagia, oral phase (R13.11) (impacttted by Cognitive status) Plan: All goals met       GO               Loretta Kenner, MS, CCC-SLP Loretta Johns 10/25/2016, 11:53 AM

## 2016-10-25 NOTE — Progress Notes (Signed)
Nutrition Brief Note  Pt's family requesting to speak with Dietitian about ways to add extra protein into patient's diet. RD provided handouts and spoke with pt's daughter at length on ways to include additional protein in pt's diet. Pt is currently drinking Ensure but can not tolerated the volume of having to drink several Ensures a day. RD answered all of the daughters questions and offered to switch supplements if needed.   Koleen Distance MS, RD, LDN Pager #- [phone] After Hours Pager: [phone]

## 2016-10-26 LAB — CBC
HEMATOCRIT: 33.8 % — AB (ref 35.0–47.0)
Hemoglobin: 11.7 g/dL — ABNORMAL LOW (ref 12.0–16.0)
MCH: 31.6 pg (ref 26.0–34.0)
MCHC: 34.6 g/dL (ref 32.0–36.0)
MCV: 91.4 fL (ref 80.0–100.0)
PLATELETS: 184 10*3/uL (ref 150–440)
RBC: 3.69 MIL/uL — AB (ref 3.80–5.20)
RDW: 14.1 % (ref 11.5–14.5)
WBC: 5.8 10*3/uL (ref 3.6–11.0)

## 2016-10-26 LAB — GASTROINTESTINAL PANEL BY PCR, STOOL (REPLACES STOOL CULTURE)
ADENOVIRUS F40/41: NOT DETECTED
ASTROVIRUS: NOT DETECTED
CAMPYLOBACTER SPECIES: NOT DETECTED
Cryptosporidium: NOT DETECTED
Cyclospora cayetanensis: NOT DETECTED
ENTEROAGGREGATIVE E COLI (EAEC): NOT DETECTED
ENTEROTOXIGENIC E COLI (ETEC): NOT DETECTED
Entamoeba histolytica: NOT DETECTED
Enteropathogenic E coli (EPEC): NOT DETECTED
GIARDIA LAMBLIA: NOT DETECTED
NOROVIRUS GI/GII: NOT DETECTED
Plesimonas shigelloides: NOT DETECTED
ROTAVIRUS A: NOT DETECTED
Salmonella species: NOT DETECTED
Sapovirus (I, II, IV, and V): NOT DETECTED
Shiga like toxin producing E coli (STEC): NOT DETECTED
Shigella/Enteroinvasive E coli (EIEC): NOT DETECTED
Vibrio cholerae: NOT DETECTED
Vibrio species: NOT DETECTED
YERSINIA ENTEROCOLITICA: NOT DETECTED

## 2016-10-26 LAB — C DIFFICILE QUICK SCREEN W PCR REFLEX
C DIFFICILE (CDIFF) INTERP: NOT DETECTED
C DIFFICLE (CDIFF) ANTIGEN: NEGATIVE
C Diff toxin: NEGATIVE

## 2016-10-26 LAB — BASIC METABOLIC PANEL
ANION GAP: 4 — AB (ref 5–15)
BUN: 12 mg/dL (ref 6–20)
CALCIUM: 8.2 mg/dL — AB (ref 8.9–10.3)
CO2: 25 mmol/L (ref 22–32)
Chloride: 110 mmol/L (ref 101–111)
Creatinine, Ser: 1.85 mg/dL — ABNORMAL HIGH (ref 0.44–1.00)
GFR calc Af Amer: 28 mL/min — ABNORMAL LOW (ref >60)
GFR, EST NON AFRICAN AMERICAN: 24 mL/min — AB (ref >60)
Glucose, Bld: 102 mg/dL — ABNORMAL HIGH (ref 65–99)
Potassium: 3.7 mmol/L (ref 3.5–5.1)
Sodium: 139 mmol/L (ref 135–145)

## 2016-10-26 MED ORDER — CIPROFLOXACIN HCL 500 MG PO TABS
250.0000 mg | ORAL_TABLET | Freq: Two times a day (BID) | ORAL | Status: DC
  Administered 2016-10-26: 10:00:00 250 mg via ORAL
  Filled 2016-10-26: qty 1

## 2016-10-26 MED ORDER — MELATONIN 5 MG PO TABS
5.0000 mg | ORAL_TABLET | Freq: Every day | ORAL | Status: DC
  Administered 2016-10-26: 02:00:00 5 mg via ORAL
  Filled 2016-10-26 (×2): qty 1

## 2016-10-26 MED ORDER — LOPERAMIDE HCL 2 MG PO CAPS: 2.0000 mg | ORAL_CAPSULE | Freq: Four times a day (QID) | ORAL | Status: DC | PRN

## 2016-10-26 MED ORDER — METRONIDAZOLE 500 MG PO TABS: 500.0000 mg | ORAL_TABLET | Freq: Three times a day (TID) | ORAL | Status: DC

## 2016-10-26 NOTE — Progress Notes (Signed)
Pt discharged via non-emergent [facility] to [facility]. Report called to accepting RN. IV removed, pt on room air and in no distress. Ammie Dalton, RN

## 2016-10-26 NOTE — Clinical Social Work Note (Signed)
Patient to be d/c'ed today to [facility].  Patient and family agreeable to plans will transport via ems RN to call report [phone].  Nicasio Barlowe, MSW, LCSWA [phone]  

## 2016-11-03 ENCOUNTER — Other Ambulatory Visit
Admission: RE | Admit: 2016-11-03 | Discharge: 2016-11-03 | Disposition: A | Discharge status: Home / Self Care | Payer: Medicare PPO | Source: Other Acute Inpatient Hospital | Attending: Family Medicine | Admitting: Family Medicine

## 2016-11-03 DIAGNOSIS — K5732 Diverticulitis of large intestine without perforation or abscess without bleeding: Secondary | ICD-10-CM | POA: Insufficient documentation

## 2016-11-03 LAB — COMPREHENSIVE METABOLIC PANEL
ALBUMIN: 2.6 g/dL — AB (ref 3.5–5.0)
ALK PHOS: 53 U/L (ref 38–126)
ALT: 17 U/L (ref 14–54)
AST: 63 U/L — ABNORMAL HIGH (ref 15–41)
Anion gap: 13 (ref 5–15)
BILIRUBIN TOTAL: 0.6 mg/dL (ref 0.3–1.2)
BUN: 25 mg/dL — AB (ref 6–20)
CALCIUM: 8.3 mg/dL — AB (ref 8.9–10.3)
CO2: 32 mmol/L (ref 22–32)
CREATININE: 1.38 mg/dL — AB (ref 0.44–1.00)
Chloride: 93 mmol/L — ABNORMAL LOW (ref 101–111)
GFR calc Af Amer: 40 mL/min — ABNORMAL LOW (ref >60)
GFR calc non Af Amer: 34 mL/min — ABNORMAL LOW (ref >60)
GLUCOSE: 119 mg/dL — AB (ref 65–99)
Potassium: 2.4 mmol/L — CL (ref 3.5–5.1)
SODIUM: 138 mmol/L (ref 135–145)
TOTAL PROTEIN: 5.8 g/dL — AB (ref 6.5–8.1)

## 2016-11-03 LAB — CBC WITH DIFFERENTIAL/PLATELET
BASOS PCT: 0 %
Basophils Absolute: 0 10*3/uL (ref 0–0.1)
EOS ABS: 0.1 10*3/uL (ref 0–0.7)
Eosinophils Relative: 1 %
HEMATOCRIT: 34.9 % — AB (ref 35.0–47.0)
HEMOGLOBIN: 12 g/dL (ref 12.0–16.0)
LYMPHS ABS: 1 10*3/uL (ref 1.0–3.6)
Lymphocytes Relative: 13 %
MCH: 31.5 pg (ref 26.0–34.0)
MCHC: 34.5 g/dL (ref 32.0–36.0)
MCV: 91.5 fL (ref 80.0–100.0)
MONOS PCT: 9 %
Monocytes Absolute: 0.6 10*3/uL (ref 0.2–0.9)
NEUTROS ABS: 5.8 10*3/uL (ref 1.4–6.5)
NEUTROS PCT: 77 %
Platelets: 339 10*3/uL (ref 150–440)
RBC: 3.82 MIL/uL (ref 3.80–5.20)
RDW: 14.2 % (ref 11.5–14.5)
WBC: 7.5 10*3/uL (ref 3.6–11.0)

## 2016-11-03 LAB — TSH: TSH: 10.87 u[IU]/mL — ABNORMAL HIGH (ref 0.350–4.500)

## 2016-11-03 LAB — URIC ACID: Uric Acid, Serum: 8.7 mg/dL — ABNORMAL HIGH (ref 2.3–6.6)

## 2016-11-20 ENCOUNTER — Encounter: Payer: Self-pay | Admitting: *Deleted

## 2016-11-20 ENCOUNTER — Observation Stay
Admission: EM | Admit: 2016-11-20 | Discharge: 2016-11-23 | Disposition: A | Discharge status: Home / Self Care | Payer: Medicare PPO | Attending: Internal Medicine | Admitting: Internal Medicine

## 2016-11-20 DIAGNOSIS — E876 Hypokalemia: Secondary | ICD-10-CM | POA: Insufficient documentation

## 2016-11-20 DIAGNOSIS — E860 Dehydration: Secondary | ICD-10-CM | POA: Insufficient documentation

## 2016-11-20 DIAGNOSIS — R197 Diarrhea, unspecified: Principal | ICD-10-CM | POA: Diagnosis present

## 2016-11-20 DIAGNOSIS — N390 Urinary tract infection, site not specified: Secondary | ICD-10-CM | POA: Insufficient documentation

## 2016-11-20 DIAGNOSIS — G9341 Metabolic encephalopathy: Secondary | ICD-10-CM | POA: Insufficient documentation

## 2016-11-20 DIAGNOSIS — R262 Difficulty in walking, not elsewhere classified: Secondary | ICD-10-CM | POA: Insufficient documentation

## 2016-11-20 DIAGNOSIS — Z853 Personal history of malignant neoplasm of breast: Secondary | ICD-10-CM | POA: Insufficient documentation

## 2016-11-20 DIAGNOSIS — E039 Hypothyroidism, unspecified: Secondary | ICD-10-CM | POA: Diagnosis not present

## 2016-11-20 DIAGNOSIS — K219 Gastro-esophageal reflux disease without esophagitis: Secondary | ICD-10-CM | POA: Diagnosis not present

## 2016-11-20 DIAGNOSIS — Z1623 Resistance to quinolones and fluoroquinolones: Secondary | ICD-10-CM | POA: Diagnosis not present

## 2016-11-20 DIAGNOSIS — I4891 Unspecified atrial fibrillation: Secondary | ICD-10-CM | POA: Diagnosis not present

## 2016-11-20 DIAGNOSIS — Z87442 Personal history of urinary calculi: Secondary | ICD-10-CM | POA: Insufficient documentation

## 2016-11-20 DIAGNOSIS — Z79899 Other long term (current) drug therapy: Secondary | ICD-10-CM | POA: Insufficient documentation

## 2016-11-20 DIAGNOSIS — Z7901 Long term (current) use of anticoagulants: Secondary | ICD-10-CM | POA: Insufficient documentation

## 2016-11-20 DIAGNOSIS — R3129 Other microscopic hematuria: Secondary | ICD-10-CM | POA: Insufficient documentation

## 2016-11-20 DIAGNOSIS — I4510 Unspecified right bundle-branch block: Secondary | ICD-10-CM | POA: Insufficient documentation

## 2016-11-20 DIAGNOSIS — Z8249 Family history of ischemic heart disease and other diseases of the circulatory system: Secondary | ICD-10-CM | POA: Insufficient documentation

## 2016-11-20 DIAGNOSIS — K580 Irritable bowel syndrome with diarrhea: Secondary | ICD-10-CM | POA: Diagnosis not present

## 2016-11-20 DIAGNOSIS — D735 Infarction of spleen: Secondary | ICD-10-CM | POA: Insufficient documentation

## 2016-11-20 DIAGNOSIS — Z9102 Food additives allergy status: Secondary | ICD-10-CM | POA: Insufficient documentation

## 2016-11-20 DIAGNOSIS — Z8051 Family history of malignant neoplasm of kidney: Secondary | ICD-10-CM | POA: Insufficient documentation

## 2016-11-20 DIAGNOSIS — I472 Ventricular tachycardia: Secondary | ICD-10-CM | POA: Diagnosis not present

## 2016-11-20 DIAGNOSIS — B9689 Other specified bacterial agents as the cause of diseases classified elsewhere: Secondary | ICD-10-CM | POA: Insufficient documentation

## 2016-11-20 DIAGNOSIS — Z90710 Acquired absence of both cervix and uterus: Secondary | ICD-10-CM | POA: Insufficient documentation

## 2016-11-20 DIAGNOSIS — G8929 Other chronic pain: Secondary | ICD-10-CM | POA: Diagnosis not present

## 2016-11-20 DIAGNOSIS — E7800 Pure hypercholesterolemia, unspecified: Secondary | ICD-10-CM | POA: Diagnosis not present

## 2016-11-20 DIAGNOSIS — F419 Anxiety disorder, unspecified: Secondary | ICD-10-CM | POA: Diagnosis not present

## 2016-11-20 DIAGNOSIS — I959 Hypotension, unspecified: Secondary | ICD-10-CM | POA: Insufficient documentation

## 2016-11-20 DIAGNOSIS — Z8612 Personal history of poliomyelitis: Secondary | ICD-10-CM | POA: Insufficient documentation

## 2016-11-20 DIAGNOSIS — I10 Essential (primary) hypertension: Secondary | ICD-10-CM | POA: Insufficient documentation

## 2016-11-20 DIAGNOSIS — Z888 Allergy status to other drugs, medicaments and biological substances status: Secondary | ICD-10-CM | POA: Insufficient documentation

## 2016-11-20 DIAGNOSIS — Z9049 Acquired absence of other specified parts of digestive tract: Secondary | ICD-10-CM | POA: Insufficient documentation

## 2016-11-20 DIAGNOSIS — Z8673 Personal history of transient ischemic attack (TIA), and cerebral infarction without residual deficits: Secondary | ICD-10-CM | POA: Insufficient documentation

## 2016-11-20 DIAGNOSIS — I480 Paroxysmal atrial fibrillation: Secondary | ICD-10-CM | POA: Insufficient documentation

## 2016-11-20 DIAGNOSIS — F0390 Unspecified dementia without behavioral disturbance: Secondary | ICD-10-CM | POA: Insufficient documentation

## 2016-11-20 DIAGNOSIS — Z841 Family history of disorders of kidney and ureter: Secondary | ICD-10-CM | POA: Insufficient documentation

## 2016-11-20 DIAGNOSIS — J301 Allergic rhinitis due to pollen: Secondary | ICD-10-CM | POA: Insufficient documentation

## 2016-11-20 DIAGNOSIS — R627 Adult failure to thrive: Secondary | ICD-10-CM | POA: Insufficient documentation

## 2016-11-20 LAB — URINALYSIS, COMPLETE (UACMP) WITH MICROSCOPIC
BILIRUBIN URINE: NEGATIVE
Bacteria, UA: NONE SEEN
Glucose, UA: NEGATIVE mg/dL
KETONES UR: NEGATIVE mg/dL
NITRITE: NEGATIVE
PROTEIN: 100 mg/dL — AB
Specific Gravity, Urine: 1.012 (ref 1.005–1.030)
Squamous Epithelial / LPF: NONE SEEN
pH: 5 (ref 5.0–8.0)

## 2016-11-20 LAB — LIPASE, BLOOD: Lipase: 29 U/L (ref 11–51)

## 2016-11-20 LAB — COMPREHENSIVE METABOLIC PANEL
ALK PHOS: 103 U/L (ref 38–126)
ALT: 50 U/L (ref 14–54)
ANION GAP: 9 (ref 5–15)
AST: 50 U/L — ABNORMAL HIGH (ref 15–41)
Albumin: 2.3 g/dL — ABNORMAL LOW (ref 3.5–5.0)
BILIRUBIN TOTAL: 0.7 mg/dL (ref 0.3–1.2)
BUN: 30 mg/dL — ABNORMAL HIGH (ref 6–20)
CALCIUM: 7.5 mg/dL — AB (ref 8.9–10.3)
CO2: 21 mmol/L — AB (ref 22–32)
Chloride: 109 mmol/L (ref 101–111)
Creatinine, Ser: 1.04 mg/dL — ABNORMAL HIGH (ref 0.44–1.00)
GFR, EST AFRICAN AMERICAN: 56 mL/min — AB (ref >60)
GFR, EST NON AFRICAN AMERICAN: 48 mL/min — AB (ref >60)
Glucose, Bld: 90 mg/dL (ref 65–99)
Potassium: 3.4 mmol/L — ABNORMAL LOW (ref 3.5–5.1)
SODIUM: 139 mmol/L (ref 135–145)
TOTAL PROTEIN: 5.9 g/dL — AB (ref 6.5–8.1)

## 2016-11-20 LAB — GASTROINTESTINAL PANEL BY PCR, STOOL (REPLACES STOOL CULTURE)
ADENOVIRUS F40/41: NOT DETECTED
ASTROVIRUS: NOT DETECTED
CYCLOSPORA CAYETANENSIS: NOT DETECTED
Campylobacter species: NOT DETECTED
Cryptosporidium: NOT DETECTED
ENTAMOEBA HISTOLYTICA: NOT DETECTED
ENTEROPATHOGENIC E COLI (EPEC): NOT DETECTED
ENTEROTOXIGENIC E COLI (ETEC): NOT DETECTED
Enteroaggregative E coli (EAEC): NOT DETECTED
Giardia lamblia: NOT DETECTED
Norovirus GI/GII: NOT DETECTED
Plesimonas shigelloides: NOT DETECTED
Rotavirus A: NOT DETECTED
Salmonella species: NOT DETECTED
Sapovirus (I, II, IV, and V): NOT DETECTED
Shiga like toxin producing E coli (STEC): NOT DETECTED
Shigella/Enteroinvasive E coli (EIEC): NOT DETECTED
VIBRIO CHOLERAE: NOT DETECTED
VIBRIO SPECIES: NOT DETECTED
Yersinia enterocolitica: NOT DETECTED

## 2016-11-20 LAB — C DIFFICILE QUICK SCREEN W PCR REFLEX
C DIFFICLE (CDIFF) ANTIGEN: NEGATIVE
C Diff interpretation: NOT DETECTED
C Diff toxin: NEGATIVE

## 2016-11-20 LAB — CBC
HCT: 35.2 % (ref 35.0–47.0)
HEMOGLOBIN: 11.9 g/dL — AB (ref 12.0–16.0)
MCH: 30.9 pg (ref 26.0–34.0)
MCHC: 33.9 g/dL (ref 32.0–36.0)
MCV: 91.3 fL (ref 80.0–100.0)
Platelets: 246 10*3/uL (ref 150–440)
RBC: 3.85 MIL/uL (ref 3.80–5.20)
RDW: 13.9 % (ref 11.5–14.5)
WBC: 4.9 10*3/uL (ref 3.6–11.0)

## 2016-11-20 LAB — MAGNESIUM: MAGNESIUM: 1.4 mg/dL — AB (ref 1.7–2.4)

## 2016-11-20 MED ORDER — METOPROLOL SUCCINATE ER 25 MG PO TB24
12.5000 mg | ORAL_TABLET | Freq: Every day | ORAL | Status: DC
  Administered 2016-11-20 – 2016-11-23 (×3): 12.5 mg via ORAL
  Filled 2016-11-20 (×4): qty 1

## 2016-11-20 MED ORDER — ACETAMINOPHEN 650 MG RE SUPP: 650.0000 mg | Freq: Four times a day (QID) | RECTAL | Status: DC | PRN

## 2016-11-20 MED ORDER — METOPROLOL SUCCINATE ER 25 MG PO TB24
25.0000 mg | ORAL_TABLET | Freq: Every day | ORAL | Status: DC
  Administered 2016-11-20: 25 mg via ORAL
  Filled 2016-11-20: qty 1

## 2016-11-20 MED ORDER — RISPERIDONE 0.5 MG PO TABS
0.5000 mg | ORAL_TABLET | Freq: Two times a day (BID) | ORAL | Status: DC
  Administered 2016-11-20 – 2016-11-23 (×6): 0.5 mg via ORAL
  Filled 2016-11-20 (×7): qty 1

## 2016-11-20 MED ORDER — COLCHICINE 0.6 MG PO TABS
0.6000 mg | ORAL_TABLET | Freq: Every day | ORAL | Status: DC
  Administered 2016-11-21: 0.6 mg via ORAL
  Filled 2016-11-20 (×2): qty 1

## 2016-11-20 MED ORDER — ACETAMINOPHEN 325 MG PO TABS
650.0000 mg | ORAL_TABLET | Freq: Four times a day (QID) | ORAL | Status: DC | PRN
  Administered 2016-11-21: 650 mg via ORAL
  Filled 2016-11-20: qty 2

## 2016-11-20 MED ORDER — LEVOTHYROXINE SODIUM 50 MCG PO TABS
50.0000 ug | ORAL_TABLET | Freq: Every day | ORAL | Status: DC
  Administered 2016-11-21 – 2016-11-22 (×2): 50 ug via ORAL
  Filled 2016-11-20 (×2): qty 1

## 2016-11-20 MED ORDER — METOPROLOL SUCCINATE ER 50 MG PO TB24
ORAL_TABLET | ORAL | Status: AC
  Filled 2016-11-20: qty 1

## 2016-11-20 MED ORDER — HYDROCODONE-ACETAMINOPHEN 5-325 MG PO TABS: 1.0000 | ORAL_TABLET | ORAL | Status: DC | PRN

## 2016-11-20 MED ORDER — POTASSIUM CHLORIDE IN NACL 20-0.9 MEQ/L-% IV SOLN
INTRAVENOUS | Status: DC
  Administered 2016-11-20 – 2016-11-22 (×4): via INTRAVENOUS
  Filled 2016-11-20 (×5): qty 1000

## 2016-11-20 MED ORDER — ONDANSETRON HCL 4 MG PO TABS: 4.0000 mg | ORAL_TABLET | Freq: Four times a day (QID) | ORAL | Status: DC | PRN

## 2016-11-20 MED ORDER — MAGNESIUM SULFATE 2 GM/50ML IV SOLN
2.0000 g | Freq: Once | INTRAVENOUS | Status: AC
  Administered 2016-11-20: 2 g via INTRAVENOUS
  Filled 2016-11-20: qty 50

## 2016-11-20 MED ORDER — ALLOPURINOL 100 MG PO TABS
100.0000 mg | ORAL_TABLET | Freq: Every day | ORAL | Status: DC
  Administered 2016-11-20 – 2016-11-22 (×3): 100 mg via ORAL
  Filled 2016-11-20 (×4): qty 1

## 2016-11-20 MED ORDER — SODIUM CHLORIDE 0.9 % IV BOLUS (SEPSIS)
500.0000 mL | Freq: Once | INTRAVENOUS | Status: AC
  Administered 2016-11-20: 500 mL via INTRAVENOUS

## 2016-11-20 MED ORDER — ALBUTEROL SULFATE (2.5 MG/3ML) 0.083% IN NEBU: 2.5000 mg | INHALATION_SOLUTION | RESPIRATORY_TRACT | Status: DC | PRN

## 2016-11-20 MED ORDER — ONDANSETRON HCL 4 MG/2ML IJ SOLN: 4.0000 mg | Freq: Four times a day (QID) | INTRAMUSCULAR | Status: DC | PRN

## 2016-11-20 MED ORDER — RIVAROXABAN 20 MG PO TABS: 20.0000 mg | ORAL_TABLET | Freq: Every day | ORAL | Status: DC

## 2016-11-20 NOTE — ED Provider Notes (Signed)
[facility]  ____________________________________________   I have reviewed the triage vital signs and the nursing notes.   HISTORY  Chief Complaint Diarrhea and Nausea    HPI Loretta Johns is a 83 y.o. female 2 has a history of recent diverticulitis with a prolonged antibiotic course presents today with watery diarrhea for last 3 or 4 days. Denies any fever or chills.patient does suffer from dementia she is at her baseline per family, no melena or bright red blood per rectum,family states clearly that wish the patient tothe and a DO NOT INTUBATE, they wish her to have medical care but they do not wish any heroic interventions. Dementia limits patient's ability to give in full historyof note, patient does have a history of atrial fibrillation did not take her medication this morning.   Past Medical History:  Diagnosis Date  . Breast cancer ([place])   . Cervicodynia   . Chronic back pain   . Chronic cystitis   . Dementia   . Dysphagia   . Esophageal reflux   . HBP (high blood pressure)   . History of augmentation mammoplasty   . History of renal stone   . Hypothyroidism   . IBS (irritable bowel syndrome)   . Incontinence   . Interstitial cystitis   . Microscopic hematuria   . Polio     Patient Active Problem List   Diagnosis Date Noted  . Acute diverticulitis 10/22/2016  . Dementia, vascular, mixed, with behavioral disturbance 06/05/2016  . Splenic infarct 01/27/2016  . Acquired hypothyroidism 08/17/2014  . Essential (primary) hypertension 08/17/2014  . Suprapubic pain 08/17/2014  . History of renal stone 08/17/2014  . Blood glucose elevated 07/24/2014  . Drug intolerance 07/23/2014  . Atrial fibrillation with rapid ventricular response ([place]) 07/31/2013  . AF (paroxysmal atrial fibrillation) ([place]) 07/31/2013  . Acid reflux 07/21/2013  . Carotid artery narrowing 06/18/2013  . H/O transient  cerebral ischemia 06/18/2013  . Accumulation of fluid in tissues 08/12/2012  . Hypercholesterolemia without hypertriglyceridemia 07/21/2012  . Cannot sleep 04/18/2011  . Post poliomyelitis syndrome 04/18/2011    Past Surgical History:  Procedure Laterality Date  . ABDOMINAL HYSTERECTOMY  1980  . BREAST ENHANCEMENT SURGERY    . CHOLECYSTECTOMY  1996  . CYSTOSCOPY  09/25/2009   with hydrodilation and bladder biopsy and fulgeration of trigone  . KNEE SURGERY Left   . Urethral polyps  07/01/1991    Prior to Admission medications   Medication Sig Start Date End Date Taking? Authorizing Provider  dicyclomine (BENTYL) 10 MG capsule Take 1 capsule (10 mg total) by mouth 4 (four) times daily -  before meals and at bedtime. Patient not taking: Reported on 06/06/2016 01/28/16   Hillary Bow, MD  hydrochlorothiazide (MICROZIDE) 12.5 MG capsule Take 12.5 mg by mouth daily.    [provider]  HYDROcodone-acetaminophen (NORCO/VICODIN) 5-325 MG tablet Take 1 tablet by mouth every 6 (six) hours as needed. 09/03/16   [provider]  hyoscyamine (LEVSIN, ANASPAZ) 0.125 MG tablet Take 0.125 mg by mouth every 4 (four) hours as needed for bladder spasms.  03/17/15   [provider]  levothyroxine (SYNTHROID, LEVOTHROID) 50 MCG tablet Take 50 mcg by mouth daily before breakfast.     [provider]  metoprolol succinate (TOPROL-XL) 25 MG 24 hr tablet Take 12.5 mg by mouth daily. 10/07/16   [provider]  ondansetron (ZOFRAN) 4 MG tablet Take 1 tablet (4 mg total) by mouth  every 6 (six) hours as needed for nausea. Patient not taking: Reported on 06/06/2016 01/28/16   Hillary Bow, MD  risperiDONE (RISPERDAL) 0.25 MG tablet Take 1 tablet (0.25 mg total) by mouth 2 (two) times daily as needed (confusion or hallucinations). Patient taking differently: Take 0.5 mg by mouth 2 (two) times daily.  06/05/16   Clapacs, Madie Reno, MD  XARELTO 20 MG TABS tablet Take 20 mg by  mouth daily with breakfast. 03/06/16   [provider]    Allergies Guatemala grass extract; Chocolate flavor; Midazolam; Pravastatin; Propoxyphene napsylate [propoxyphene]; and Reglan [metoclopramide]  Family History  Problem Relation Age of Onset  . CAD Mother   . Cancer Father   . Kidney disease Brother        Reanl cancer  . Prostate cancer Neg Hx     Social History Social History  Substance Use Topics  . Smoking status: Never Smoker  . Smokeless tobacco: Never Used  . Alcohol use No    Review of Systems Constitutional: No fever/chills Eyes: No visual changes. ENT: No sore throat. No stiff neck no neck pain Cardiovascular: Denies chest pain. Respiratory: Denies shortness of breath. Gastrointestinal:  no vomiting positive diarrheaGenitourinary: Negative for dysuria. Musculoskeletal: Negative lower extremity swelling Skin: Negative for rash. Neurological: Negative for severe headaches, focal weakness or numbness.   ____________________________________________   PHYSICAL EXAM:  VITAL SIGNS: ED Triage Vitals  Enc Vitals Group     BP 11/20/16 1259 106/81     Pulse Rate 11/20/16 1259 (!) 124     Resp 11/20/16 1259 20     Temp 11/20/16 1259 97.7 F (36.5 C)     Temp Source 11/20/16 1259 Oral     SpO2 11/20/16 1259 96 %     Weight 11/20/16 1301 140 lb (63.5 kg)     Height 11/20/16 1301 5\' 2"  (1.575 m)     Head Circumference --      Peak Flow --      Pain Score --      Pain Loc --      Pain Edu? --      Excl. in [place]? --     Constitutional: patient in no acute distress pleasantly demented, Eyes: Conjunctivae are normal Head: Atraumatic HEENT: No congestion/rhinnorhea. Mucous membranes are moist.  Oropharynx non-erythematous Neck:   Nontender with no meningismus, no masses, no stridor Cardiovascular: tachycardia with irregularly irregular rhythm. Grossly normal heart sounds.  Good peripheral circulation. Respiratory: Normal respiratory effort.  No  retractions. Lungs CTAB. Abdominal: Soft and nontender. No distention. No guarding no rebound Back:  There is no focal tenderness or step off.  there is no midline tenderness there are no lesions noted. there is no CVA tenderness Musculoskeletal: No lower extremity tenderness, no upper extremity tenderness. No joint effusions, no DVT signs strong distal pulses no edema Neurologic:  Normal speech and language. No gross focal neurologic deficits are appreciated.  Skin:  Skin is warm, dry and intact. No rash noted. Psychiatric: Mood and affect are normal. Speech and behavior are normal.  ____________________________________________   LABS (all labs ordered are listed, but only abnormal results are displayed)  Labs Reviewed  C DIFFICILE QUICK SCREEN W PCR REFLEX  GASTROINTESTINAL PANEL BY PCR, STOOL (REPLACES STOOL CULTURE)  LIPASE, BLOOD  COMPREHENSIVE METABOLIC PANEL  CBC  URINALYSIS, COMPLETE (UACMP) WITH MICROSCOPIC    Pertinent labs  results that were available during my care of the patient were reviewed by me and considered in my  medical decision making (see chart for details). ____________________________________________  EKG  I personally interpreted any EKGs ordered by me or triage  ____________________________________________  RADIOLOGY  Pertinent labs & imaging results that were available during my care of the patient were reviewed by me and considered in my medical decision making (see chart for details). If possible, patient and/or family made aware of any abnormal findings. ____________________________________________    PROCEDURES  Procedure(s) performed: None  Procedures  Critical Care performed: None  ____________________________________________   INITIAL IMPRESSION / ASSESSMENT AND PLAN / ED COURSE  Pertinent labs & imaging results that were available during my care of the patient were reviewed by me and considered in my medical decision making (see  chart for details).  patient with a reassuring abdominal exam here with recurrent diarrhea for 3 days, and feeling generally way. She is tachycardiac but she has not taken any for rate control medications this point. We'll start with IV fluid. Blood pressure is slightly low, does not appear to be otherwise septic however is my hope that we can give her IV fluid and we will check for C. difficile and send a biofire as well. Patient likely will require admission    ____________________________________________   FINAL CLINICAL IMPRESSION(S) / ED DIAGNOSES  Final diagnoses:  None      This chart was dictated using voice recognition software.  Despite best efforts to proofread,  errors can occur which can change meaning.      Schuyler Amor, MD 11/20/16 1430

## 2016-11-20 NOTE — H&P (Signed)
[facility] [place] NAME: Loretta Johns    MR#:  [id number]  DATE OF BIRTH:  1931/10/18  DATE OF ADMISSION:  11/20/2016  PRIMARY CARE PHYSICIAN: [facility]   REQUESTING/REFERRING PHYSICIAN: Schuyler Amor, MD  CHIEF COMPLAINT:   Chief Complaint  Patient presents with  . Diarrhea  . Nausea   Diarrhea 3-4 days. HISTORY OF PRESENT ILLNESS:  Loretta Johns  is a 83 y.o. female with a known history of Diverticulitis, IBS, breast cancer, chronic back pain, A. Fib on Xarelto, polio and dementia. The patient was sent from home to the ED due to above chief complaints. The patient is demented, unable to provide any information. According to her daughter, the patient was treated with diverticulitis with Cipro and Flagyl for about 20 days. She was discharged to rehabilitation facility from the hospital and discharged to home 5 days ago. She has had watery diarrhea for the past 3-4 days. She also had some abdominal pain but no bloody stool or melena. The patient was found to have hypotension and A. fib with RVR at 120s in the ED. She was treated with normal saline bolus in the ED.  PAST MEDICAL HISTORY:   Past Medical History:  Diagnosis Date  . Breast cancer ([place])   . Cervicodynia   . Chronic back pain   . Chronic cystitis   . Dementia   . Dysphagia   . Esophageal reflux   . HBP (high blood pressure)   . History of augmentation mammoplasty   . History of renal stone   . Hypothyroidism   . IBS (irritable bowel syndrome)   . Incontinence   . Interstitial cystitis   . Microscopic hematuria   . Polio     PAST SURGICAL HISTORY:   Past Surgical History:  Procedure Laterality Date  . ABDOMINAL HYSTERECTOMY  1980  . BREAST ENHANCEMENT SURGERY    . CHOLECYSTECTOMY  1996  . CYSTOSCOPY  09/25/2009   with hydrodilation and bladder biopsy and fulgeration of trigone  . KNEE SURGERY Left   . Urethral polyps   07/01/1991    SOCIAL HISTORY:   Social History  Substance Use Topics  . Smoking status: Never Smoker  . Smokeless tobacco: Never Used  . Alcohol use No    FAMILY HISTORY:   Family History  Problem Relation Age of Onset  . CAD Mother   . Cancer Father   . Kidney disease Brother        Reanl cancer  . Prostate cancer Neg Hx     DRUG ALLERGIES:   Allergies  Allergen Reactions  . [place] Grass Extract Itching  . Chocolate Flavor Other (See Comments)    Reaction: unknown  . Midazolam Other (See Comments)    Reaction: trouble waking up  . Pravastatin Other (See Comments)    WAS UNABLE TO WALK PER PT.  Marland Kitchen Propoxyphene Napsylate [Propoxyphene] Other (See Comments)    Darvocet-N 100 Reaction: unknown  . Reglan [Metoclopramide] Other (See Comments)    Reaction: felt like tongue was "drawing up"    REVIEW OF SYSTEMS:   Review of Systems  Unable to perform ROS: Dementia    MEDICATIONS AT HOME:   Prior to Admission medications   Medication Sig Start Date End Date Taking? Authorizing Provider  allopurinol (ZYLOPRIM) 100 MG tablet Take 100 mg by mouth daily. 11/15/16  Yes [provider]  colchicine 0.6 MG tablet Take 0.6 mg by mouth  daily. 11/15/16  Yes [provider]  hydrochlorothiazide (MICROZIDE) 12.5 MG capsule Take 12.5 mg by mouth daily.   Yes [provider]  Levothyroxine Sodium 75 MCG CAPS Take 50 mcg by mouth daily before breakfast.    Yes [provider]  metoprolol succinate (TOPROL-XL) 25 MG 24 hr tablet Take 12.5 mg by mouth daily. 10/07/16  Yes [provider]  risperiDONE (RISPERDAL) 0.25 MG tablet Take 1 tablet (0.25 mg total) by mouth 2 (two) times daily as needed (confusion or hallucinations). Patient taking differently: Take 0.5 mg by mouth 2 (two) times daily.  06/05/16  Yes Clapacs, Madie Reno, MD  XARELTO 20 MG TABS tablet Take 20 mg by mouth daily with breakfast. 03/06/16  Yes [provider]    dicyclomine (BENTYL) 10 MG capsule Take 1 capsule (10 mg total) by mouth 4 (four) times daily -  before meals and at bedtime. Patient not taking: Reported on 11/20/2016 01/28/16   Hillary Bow, MD  HYDROcodone-acetaminophen (NORCO/VICODIN) 5-325 MG tablet Take 1 tablet by mouth every 6 (six) hours as needed. 09/03/16   [provider]  ondansetron (ZOFRAN) 4 MG tablet Take 1 tablet (4 mg total) by mouth every 6 (six) hours as needed for nausea. Patient not taking: Reported on 06/06/2016 01/28/16   Hillary Bow, MD      VITAL SIGNS:  Blood pressure 111/81, pulse (!) 117, temperature 97.7 F (36.5 C), temperature source Oral, resp. rate 18, height 5\' 2"  (1.575 m), weight 140 lb (63.5 kg), SpO2 93 %.  PHYSICAL EXAMINATION:  Physical Exam  GENERAL:  83 y.o.-year-old patient lying in the bed with no acute distress.  EYES: Pupils equal, round, reactive to light and accommodation. No scleral icterus. Extraocular muscles intact.  HEENT: Head atraumatic, normocephalic NECK:  Supple, no jugular venous distention. No thyroid enlargement, no tenderness.  LUNGS: Normal breath sounds bilaterally, no wheezing, rales,rhonchi or crepitation. No use of accessory muscles of respiration.  CARDIOVASCULAR: Irregular rate and rhythm, tachycardia No murmurs, rubs, or gallops.  ABDOMEN: Soft, has tenderness, nondistended. Bowel sounds present. No organomegaly or mass.  EXTREMITIES: No pedal edema, cyanosis, or clubbing.  NEUROLOGIC: Unable to exam. PSYCHIATRIC: The patient is demented. SKIN: No obvious rash, lesion, or ulcer.   LABORATORY PANEL:   CBC  Recent Labs Lab 11/20/16 1300  WBC 4.9  HGB 11.9*  HCT 35.2  PLT 246   ------------------------------------------------------------------------------------------------------------------  Chemistries   Recent Labs Lab 11/20/16 1300  NA 139  K 3.4*  CL 109  CO2 21*  GLUCOSE 90  BUN 30*  CREATININE 1.04*  CALCIUM 7.5*  AST 50*   ALT 50  ALKPHOS 103  BILITOT 0.7   ------------------------------------------------------------------------------------------------------------------  Cardiac Enzymes No results for input(s): TROPONINI in the last 168 hours. ------------------------------------------------------------------------------------------------------------------  RADIOLOGY:  No results found.    IMPRESSION AND PLAN:   Diarrhea, rule out C. difficile colitis. The patient will be placed for observation. Follow-up GI panel and C. difficile test, IV fluid support.  A. fib with RVR. Possible due to dehydration and diarrhea. Continue Lopressor and Xarelto.  Hypotension. Given normal saline bolus, blood pressure is normal now. Continue IV normal saline.  Dehydration. Continue normal saline IV and follow-up BMP.  Acute metabolic encephalopathy due to above.  Hypokalemia. Give potassium supplement and follow-up potassium and magnesium level.  Dementia. Aspiration and fall precaution. PT evaluation.  All the records are reviewed and case discussed with ED provider. Management plans discussed with the patient's daughter and they are  in agreement.  CODE STATUS: DO NOT RESUSCITATE  TOTAL TIME TAKING CARE OF THIS PATIENT: 46 minutes.    Demetrios Loll M.D on 11/20/2016 at 3:26 PM  Between 7am to 6pm - Pager - [phone]  After 6pm go to [email or web address] - Proofreader  [facility]  Office  [phone]  CC: Primary care physician; [facility]   Note: This dictation was prepared with Dragon dictation along with smaller phrase technology. Any transcriptional errors that result from this process are unin

## 2016-11-20 NOTE — ED Triage Notes (Addendum)
Pt presents to ed from unknown facility via EMS c/o diarrhea w/hx of c. diff and nausea. Per EMS patient was 90% O2 sat on arrival, placed on 3 L [place], now  97%. Pt also was tachy with afib per EMS , was given 1 L of sodium chloride.

## 2016-11-20 NOTE — Progress Notes (Signed)
Advanced Care Plan.  Purpose of Encounter: CODE STATUS Parties in Attendance: The patient, her daughter and me. Patient's Decisional Capacity: No. Medical Story: Loretta Johns  is a 83 y.o. female with a known history of Diverticulitis, IBS, breast cancer, chronic back pain, A. Fib on Xarelto, polio and dementia. The patient was sent from home to the ED due to diarrhea 3-4 days. I discussed with her daughter about the patient's condition and CODE STATUS. Her daughter is her POA, who wants DO NOT RESUSCITATE.  Plan:  Code Status: DNR.  Time spent discussing advance care planning: 17 minutes.

## 2016-11-20 NOTE — ED Notes (Signed)
Discussed heart rate  with Dr. Burlene Arnt, stable with no new orders.

## 2016-11-21 ENCOUNTER — Encounter: Payer: Self-pay | Admitting: *Deleted

## 2016-11-21 LAB — BASIC METABOLIC PANEL
Anion gap: 6 (ref 5–15)
BUN: 26 mg/dL — AB (ref 6–20)
CHLORIDE: 108 mmol/L (ref 101–111)
CO2: 25 mmol/L (ref 22–32)
CREATININE: 1.02 mg/dL — AB (ref 0.44–1.00)
Calcium: 8.3 mg/dL — ABNORMAL LOW (ref 8.9–10.3)
GFR calc non Af Amer: 49 mL/min — ABNORMAL LOW (ref >60)
GFR, EST AFRICAN AMERICAN: 57 mL/min — AB (ref >60)
GLUCOSE: 92 mg/dL (ref 65–99)
Potassium: 4.1 mmol/L (ref 3.5–5.1)
Sodium: 139 mmol/L (ref 135–145)

## 2016-11-21 LAB — CBC
HCT: 32.2 % — ABNORMAL LOW (ref 35.0–47.0)
Hemoglobin: 10.9 g/dL — ABNORMAL LOW (ref 12.0–16.0)
MCH: 31.6 pg (ref 26.0–34.0)
MCHC: 34 g/dL (ref 32.0–36.0)
MCV: 92.9 fL (ref 80.0–100.0)
PLATELETS: 222 10*3/uL (ref 150–440)
RBC: 3.47 MIL/uL — AB (ref 3.80–5.20)
RDW: 13.8 % (ref 11.5–14.5)
WBC: 3.8 10*3/uL (ref 3.6–11.0)

## 2016-11-21 LAB — MAGNESIUM: Magnesium: 2.2 mg/dL (ref 1.7–2.4)

## 2016-11-21 MED ORDER — DEXTROSE 5 % IV SOLN
1.0000 g | INTRAVENOUS | Status: DC
  Administered 2016-11-21 – 2016-11-22 (×2): 1 g via INTRAVENOUS
  Filled 2016-11-21 (×2): qty 10

## 2016-11-21 MED ORDER — RIVAROXABAN 15 MG PO TABS
15.0000 mg | ORAL_TABLET | Freq: Every day | ORAL | Status: DC
  Administered 2016-11-21 – 2016-11-22 (×2): 15 mg via ORAL
  Filled 2016-11-21 (×3): qty 1

## 2016-11-21 MED ORDER — ENSURE ENLIVE PO LIQD
237.0000 mL | Freq: Two times a day (BID) | ORAL | Status: DC
  Administered 2016-11-21 – 2016-11-22 (×2): 237 mL via ORAL

## 2016-11-21 MED ORDER — INFLUENZA VAC SPLIT HIGH-DOSE 0.5 ML IM SUSY
0.5000 mL | PREFILLED_SYRINGE | INTRAMUSCULAR | Status: DC
  Filled 2016-11-21: qty 0.5

## 2016-11-21 NOTE — Care Management Obs Status (Signed)
[facility] NOTIFICATION   Patient Details  Name: Loretta Johns MRN: [id number] Date of Birth: June 07, 1931   Medicare Observation Status Notification Given:  Yes Notice signed by patient's daughter. one given to patient and the other to HIM for scanning    Katrina Stack, RN 11/21/2016, 10:13 AM

## 2016-11-21 NOTE — Progress Notes (Signed)
[facility] at [place] NAME: Loretta Johns    MR#:  [id number]  DATE OF BIRTH:  10-31-1931  SUBJECTIVE:  CHIEF COMPLAINT:   Chief Complaint  Patient presents with  . Diarrhea  . Nausea    recent admission for diverticulitis, sent to rehab, but did not improve much sent home from rehab 3 days ago. Brought here as have decreased appetite and weakness. Also lost 20 Lb weight in last 5-6 weeks as per daughter.  REVIEW OF SYSTEMS:   Pt Is demented and does not give any details or complains.  ROS  DRUG ALLERGIES:   Allergies  Allergen Reactions  . [place] Grass Extract Itching  . Chocolate Flavor Other (See Comments)    Reaction: unknown  . Midazolam Other (See Comments)    Reaction: trouble waking up  . Pravastatin Other (See Comments)    WAS UNABLE TO WALK PER PT.  Marland Kitchen Propoxyphene Napsylate [Propoxyphene] Other (See Comments)    Darvocet-N 100 Reaction: unknown  . Reglan [Metoclopramide] Other (See Comments)    Reaction: felt like tongue was "drawing up"    VITALS:  Blood pressure 113/80, pulse 67, temperature 98.3 F (36.8 C), temperature source Oral, resp. rate 18, height 5\' 2"  (1.575 m), weight 63.5 kg (140 lb), SpO2 99 %.  PHYSICAL EXAMINATION:  GENERAL:  83 y.o.-year-old patient lying in the bed with no acute distress.  EYES: Pupils equal, round, reactive to light and accommodation. No scleral icterus. Extraocular muscles intact.  HEENT: Head atraumatic, normocephalic. Oropharynx and nasopharynx clear.  NECK:  Supple, no jugular venous distention. No thyroid enlargement, no tenderness.  LUNGS: Normal breath sounds bilaterally, no wheezing, rales,rhonchi or crepitation. No use of accessory muscles of respiration.  CARDIOVASCULAR: S1, S2 normal. No murmurs, rubs, or gallops.  ABDOMEN: Soft, nontender, nondistended. Bowel sounds present. No organomegaly or mass.  EXTREMITIES: No pedal edema, cyanosis, or clubbing.  NEUROLOGIC:  Cranial nerves could not check as pt is not following commands. Muscle strength 2-3/5 in Upper extremities and 1/5 in lower extremities. Sensation and Gait not checked.  PSYCHIATRIC: The patient is alert and oriented x 0.  SKIN: No obvious rash, lesion, or ulcer.   Physical Exam LABORATORY PANEL:   CBC  Recent Labs Lab 11/21/16 0517  WBC 3.8  HGB 10.9*  HCT 32.2*  PLT 222   ------------------------------------------------------------------------------------------------------------------  Chemistries   Recent Labs Lab 11/20/16 1300 11/21/16 0517  NA 139 139  K 3.4* 4.1  CL 109 108  CO2 21* 25  GLUCOSE 90 92  BUN 30* 26*  CREATININE 1.04* 1.02*  CALCIUM 7.5* 8.3*  MG 1.4* 2.2  AST 50*  --   ALT 50  --   ALKPHOS 103  --   BILITOT 0.7  --    ------------------------------------------------------------------------------------------------------------------  Cardiac Enzymes No results for input(s): TROPONINI in the last 168 hours. ------------------------------------------------------------------------------------------------------------------  RADIOLOGY:  No results found.  ASSESSMENT AND PLAN:   Active Problems:   Diarrhea  * Diarrhea, ruled out C. difficile colitis.  Negative GI panel and C. difficile test, IV fluid support.   Likely due to IBS or viral, Improved.  * UTI   IV rocephin, Sent Ur cx  * A. fib with RVR. Possible due to dehydration and diarrhea. Continue Lopressor and Xarelto.  * Hypotension. Given normal saline bolus, blood pressure is normal now. Continue IV normal saline.  * Dehydration. Continue normal saline IV and follow-up BMP.  * Acute metabolic encephalopathy due to  above.  Also have Baseline dementia.  * Hypokalemia. Give potassium supplement and follow-up potassium and magnesium level.  * Dementia. Aspiration and fall precaution. PT evaluation.    All the records are reviewed and case discussed with Care  Management/Social Workerr. Management plans discussed with the patient, family and they are in agreement.  CODE STATUS: DNR  TOTAL TIME TAKING CARE OF THIS PATIENT: 35 minutes.    POSSIBLE D/C IN 1-2 DAYS, DEPENDING ON CLINICAL CONDITION.   Vaughan Basta M.D on 11/21/2016   Between 7am to 6pm - Pager - [phone]  After 6pm go to [email or web address] - password [email or web address]  Office  [phone]  CC: Primary care physician; [facility]  Note: This dictation was prepared with Dragon dictation along with smaller phrase technology. Any transcriptional errors that result from this process are unintentional.

## 2016-11-21 NOTE — Evaluation (Signed)
Physical Therapy Evaluation Patient Details Name: Lamya Lausch MRN: [id number] DOB: 1931-06-30 Today's Date: 11/21/2016   History of Present Illness  presented to ER secondary to nausea/vomiting, diarrhea and abdominal pain; admitted with afib with RVR, hypotension.  CDiff ruled out.  Of note, was recently in STR x2-3 weeks and discharged home 2-3 days prior to this admission.  Clinical Impression  Upon evaluation, patient alert and oriented to self only; follows simple commands but requires increased time/encouragement to complete.  Baseline L hemi-body weakness due to childhood polio, but worsening in recent weeks per daughter.  Absent R ankle DF also noted.  Denies sensory deficit.  Currently requiring mod/max assist for rolling, max/total assist for supine/sit; mod/max assist for unsupported sitting balance due to R posterior/lateral trunk lean with limited spontaneous righting reactions.  Does correct with consistent cuing/facilitation from therapist, but unable to progress to OOB efforts due to fatigue and bladder incontinence during session. Would benefit from skilled PT to address above deficits and promote optimal return to PLOF; recommend transition to STR upon discharge from acute hospitalization.     Follow Up Recommendations SNF    Equipment Recommendations       Recommendations for Other Services OT consult     Precautions / Restrictions Precautions Precautions: Fall Restrictions Weight Bearing Restrictions: No      Mobility  Bed Mobility Overal bed mobility: Needs Assistance Bed Mobility: Supine to Sit;Sit to Supine     Supine to sit: Max assist Sit to supine: Max assist;Total assist   General bed mobility comments: assist for LE management, truncal elevation  Transfers                 General transfer comment: unsafe/unable  Ambulation/Gait             General Gait Details: unsafe/unable  Stairs            Wheelchair  Mobility    Modified Rankin (Stroke Patients Only)       Balance Overall balance assessment: Needs assistance Sitting-balance support: No upper extremity supported;Feet supported Sitting balance-Leahy Scale: Zero Sitting balance - Comments: R posterior/lateral lean with delayed/absent spontaneous righting reactions       Standing balance comment: unsafe/unable                             Pertinent Vitals/Pain Pain Assessment: No/denies pain    Home Living Family/patient expects to be discharged to:: Private residence Living Arrangements: Children Available Help at Discharge: Family;Available 24 hours/day Type of Home: House Home Access: Ramped entrance     Home Layout: One level Home Equipment: Hospital bed (hoyer lift)      Prior Function Level of Independence: Needs assistance         Comments: At baseline, ambulatory for household distances with RW, mod indep and uses scooter for community distances.  Daughter describes progressive fucntional decline over recent weeks/months due to illnesses.  After recent stay at [facility], patient able to make gains towards baseline, but unable to maintain due to recurrent illnesses.  Most recently using hospital bed, hoyer lift immediately prior to this admission.     Hand Dominance        Extremity/Trunk Assessment   Upper Extremity Assessment Upper Extremity Assessment:  (R UE grossly 4-/5 throughout; L shoulder 2+/5, elbow and wrist 3-/5.  Denies sensory deficit)    Lower Extremity Assessment Lower Extremity Assessment:  (R LE grossly 3-/5, except ankle  DF 0/5; L LE grossly 1 to 2-/5 hip and knee, 0/5 ankle.  Denies sensory deficit)       Communication   Communication: HOH  Cognition Arousal/Alertness: Awake/alert Behavior During Therapy: WFL for tasks assessed/performed Overall Cognitive Status: History of cognitive impairments - at baseline                                         General Comments      Exercises Other Exercises Other Exercises: Unsupported sitting edge of bed, worked on static sitting balance, midline orientation requiring mod/max assist (improving to min assist at times) for R posterior/lateral lean.  Initially hesitant/resistant to anterior trunk lean/weight shift; improved with placement of elevated tray table anterior to patient to participate with dynamic reaching activities.  With contant cuing and training, able to maintain for brief periods with min assist, but unable to progress to further functional activities (due to fatigue and bladder incontinence) Other Exercises: Rolling R, max/dep; rolling L, mod/max assist.  Dep assist for hygiene, clothing management   Assessment/Plan    PT Assessment Patient needs continued PT services  PT Problem List Decreased strength;Decreased range of motion;Decreased activity tolerance;Decreased balance;Decreased mobility;Decreased coordination;Decreased cognition;Decreased knowledge of use of DME;Decreased safety awareness;Decreased knowledge of precautions;Cardiopulmonary status limiting activity       PT Treatment Interventions DME instruction;Gait training;Functional mobility training;Therapeutic activities;Therapeutic exercise;Balance training;Patient/family education;Neuromuscular re-education    PT Goals (Current goals can be found in the Care Plan section)  Acute Rehab PT Goals Patient Stated Goal: to return to STR for more therapy PT Goal Formulation: With patient/family Time For Goal Achievement: 12/05/16 Potential to Achieve Goals: Fair Additional Goals Additional Goal #1: Assess and establish goals for OOB activities as appropriate.    Frequency Min 2X/week   Barriers to discharge Decreased caregiver support      Co-evaluation               AM-PAC PT "6 Clicks" Daily Activity  Outcome Measure Difficulty turning over in bed (including adjusting bedclothes, sheets and blankets)?:  Unable Difficulty moving from lying on back to sitting on the side of the bed? : Unable Difficulty sitting down on and standing up from a chair with arms (e.g., wheelchair, bedside commode, etc,.)?: Unable Help needed moving to and from a bed to chair (including a wheelchair)?: Total Help needed walking in hospital room?: Total Help needed climbing 3-5 steps with a railing? : Total 6 Click Score: 6    End of Session Equipment Utilized During Treatment: Gait belt Activity Tolerance: Patient tolerated treatment well Patient left: in bed;with call bell/phone within reach;with nursing/sitter in room (nursing to complete clothing management, replace ext cath and place bed alarm when complete) Nurse Communication: Mobility status PT Visit Diagnosis: Difficulty in walking, not elsewhere classified (R26.2);Muscle weakness (generalized) (M62.81)    Time: 1125-1207 PT Time Calculation (min) (ACUTE ONLY): 42 min   Charges:   PT Evaluation $PT Eval Moderate Complexity: 1 Mod PT Treatments $Therapeutic Activity: 38-52 mins   PT G Codes:   PT G-Codes **NOT FOR INPATIENT CLASS** Functional Assessment Tool Used: AM-PAC 6 Clicks Basic Mobility Functional Limitation: Mobility: Walking and moving around Mobility: Walking and Moving Around Current Status (R5188): At least 80 percent but less than 100 percent impaired, limited or restricted Mobility: Walking and Moving Around Goal Status [id number]): At least 20 percent but less than 40 percent impaired, limited  or restricted    Rhiana Morash H. Owens Shark, PT, DPT, NCS 11/21/16, 2:54 PM [phone]

## 2016-11-21 NOTE — Care Management (Signed)
Placed in observation after presenting with diarrhea and tachycardia.  She has been discharged from [facility] within the last 3 days and was to have been opened by [facility] today.  Notified agency that patient has been placed in observation.

## 2016-11-21 NOTE — Care Management (Signed)
Discussed possible benefit of palliative care consult for goals of care.  There is concern that patient has been on a gradual decline over the past month or so and decreased appetite and weight loss. She is a DNR.  Physical therapy verbally reported that are recommending skilled nursing facility placement.  Discussed that patient was just release three days prior. It is unknown if patient's decline in status is related to current UTI oris patient becoming chronically debilitated

## 2016-11-21 NOTE — Care Management (Signed)
Patient's daughter- Orlinda Blalock would like to speak with financial counselor to discuss some of patient's current balances.  She does have POA/HCPOA and these documents are present in [employer]

## 2016-11-21 NOTE — Plan of Care (Signed)
Problem: Education: Goal: Knowledge of [facility] Education information/materials will improve Outcome: Progressing No complaints of pain, pt is confused, only alert to self. Daughter at bedside. Normal saline with 20 of K infusing. Potassium now therapeutic. On acute O2. External catheter in place, urine very dark brown & think, UA sent.

## 2016-11-21 NOTE — NC FL2 (Signed)
[place] LEVEL OF CARE SCREENING TOOL     IDENTIFICATION  Patient Name: Loretta Johns Birthdate: 08-07-1931 Sex: female Admission Date (Current Location): 11/20/2016  [place] and [place] Number:  Engineering geologist and Address:  [facility], [address] [place], [place] [address]      Provider Number: [id number]  Attending Physician Name and Address:  Vaughan Basta, *  Relative Name and Phone Number:  Delane Ginger Daughter   [phone]     Current Level of Care: Hospital Recommended Level of Care: [place] Prior Approval Number:    Date Approved/Denied:   PASRR Number: [id number]  Discharge Plan: SNF    Current Diagnoses: Patient Active Problem List   Diagnosis Date Noted  . Diarrhea 11/20/2016  . Acute diverticulitis 10/22/2016  . Dementia, vascular, mixed, with behavioral disturbance 06/05/2016  . Splenic infarct 01/27/2016  . Acquired hypothyroidism 08/17/2014  . Essential (primary) hypertension 08/17/2014  . Suprapubic pain 08/17/2014  . History of renal stone 08/17/2014  . Blood glucose elevated 07/24/2014  . Drug intolerance 07/23/2014  . Atrial fibrillation with rapid ventricular response ([place]) 07/31/2013  . AF (paroxysmal atrial fibrillation) ([place]) 07/31/2013  . Acid reflux 07/21/2013  . Carotid artery narrowing 06/18/2013  . H/O transient cerebral ischemia 06/18/2013  . Accumulation of fluid in tissues 08/12/2012  . Hypercholesterolemia without hypertriglyceridemia 07/21/2012  . Cannot sleep 04/18/2011  . Post poliomyelitis syndrome 04/18/2011    Orientation RESPIRATION BLADDER Height & Weight     Self, Place  Normal Continent Weight: 140 lb (63.5 kg) Height:  5\' 2"  (157.5 cm)  BEHAVIORAL SYMPTOMS/MOOD NEUROLOGICAL BOWEL NUTRITION STATUS      Continent Diet (Cardiac)  AMBULATORY STATUS COMMUNICATION OF NEEDS Skin   Limited Assist Verbally Normal                      Personal Care Assistance Level of Assistance  Bathing, Feeding, Dressing Bathing Assistance: Limited assistance Feeding assistance: Limited assistance Dressing Assistance: Limited assistance     Functional Limitations Info  Sight, Hearing, Speech Sight Info: Adequate Hearing Info: Adequate Speech Info: Adequate    SPECIAL CARE FACTORS FREQUENCY  PT (By licensed PT), OT (By licensed OT)     PT Frequency: 5x a week OT Frequency: 5x a week            Contractures Contractures Info: Not present    Additional Factors Info  Code Status, Allergies, Psychotropic Code Status Info: DNR Allergies Info: [place] GRASS EXTRACT, CHOCOLATE FLAVOR, MIDAZOLAM, PRAVASTATIN, PROPOXYPHENE NAPSYLATE PROPOXYPHENE, REGLAN METOCLOPRAMIDE  Psychotropic Info: risperiDONE (RISPERDAL) tablet 0.5 mg         Current Medications (11/21/2016):  This is the current hospital active medication list Current Facility-Administered Medications  Medication Dose Route Frequency Provider Last Rate Last Dose  . 0.9 % NaCl with KCl 20 mEq/ L  infusion   Intravenous Continuous Demetrios Loll, MD 75 mL/hr at 11/21/16 [phone]    . acetaminophen (TYLENOL) tablet 650 mg  650 mg Oral Q6H PRN Demetrios Loll, MD       Or  . acetaminophen (TYLENOL) suppository 650 mg  650 mg Rectal Q6H PRN Demetrios Loll, MD      . albuterol (PROVENTIL) (2.5 MG/3ML) 0.083% nebulizer solution 2.5 mg  2.5 mg Nebulization Q2H PRN Demetrios Loll, MD      . allopurinol (ZYLOPRIM) tablet 100 mg  100 mg Oral Daily Demetrios Loll, MD   100 mg at 11/21/16 0907  .  cefTRIAXone (ROCEPHIN) 1 g in dextrose 5 % 50 mL IVPB  1 g Intravenous Q24H Vaughan Basta, MD   Stopped at 11/21/16 1342  . colchicine tablet 0.6 mg  0.6 mg Oral Daily Demetrios Loll, MD   0.6 mg at 11/21/16 0907  . feeding supplement (ENSURE ENLIVE) (ENSURE ENLIVE) liquid 237 mL  237 mL Oral BID BM Vaughan Basta, MD   237 mL at 11/21/16 1703  . HYDROcodone-acetaminophen  (NORCO/VICODIN) 5-325 MG per tablet 1 tablet  1 tablet Oral Q4H PRN Demetrios Loll, MD      . levothyroxine (SYNTHROID, LEVOTHROID) tablet 50 mcg  50 mcg Oral QAC breakfast Demetrios Loll, MD   50 mcg at 11/21/16 0906  . metoprolol succinate (TOPROL-XL) 24 hr tablet 12.5 mg  12.5 mg Oral Daily Demetrios Loll, MD   12.5 mg at 11/20/16 2034  . ondansetron (ZOFRAN) tablet 4 mg  4 mg Oral Q6H PRN Demetrios Loll, MD       Or  . ondansetron [facility]) injection 4 mg  4 mg Intravenous Q6H PRN Demetrios Loll, MD      . risperiDONE (RISPERDAL) tablet 0.5 mg  0.5 mg Oral BID Demetrios Loll, MD   0.5 mg at 11/21/16 0908  . Rivaroxaban (XARELTO) tablet 15 mg  15 mg Oral Q breakfast Vaughan Basta, MD   15 mg at 11/21/16 1019     Discharge Medications: Please see discharge summary for a list of discharge medications.  Relevant Imaging Results:  Relevant Lab Results:   Additional Information SSN [id number]  Ross Ludwig, [place]

## 2016-11-21 NOTE — Progress Notes (Signed)
No diarrhea,denies pain,antibiotics started for UTI,iv fluids continues.

## 2016-11-21 NOTE — Progress Notes (Signed)
[place] for Xarelto Indication: atrial fibrillation  Allergies  Allergen Reactions  . [place] Grass Extract Itching  . Chocolate Flavor Other (See Comments)    Reaction: unknown  . Midazolam Other (See Comments)    Reaction: trouble waking up  . Pravastatin Other (See Comments)    WAS UNABLE TO WALK PER PT.  Marland Kitchen Propoxyphene Napsylate [Propoxyphene] Other (See Comments)    Darvocet-N 100 Reaction: unknown  . Reglan [Metoclopramide] Other (See Comments)    Reaction: felt like tongue was "drawing up"    Patient Measurements: Height: 5\' 2"  (157.5 cm) Weight: 140 lb (63.5 kg) IBW/kg (Calculated) : 50.1  Vital Signs: Temp: 97.5 F (36.4 C) (10/10 0739) Temp Source: Oral (10/10 0739) BP: 101/49 (10/10 0739) Pulse Rate: 57 (10/10 0739)  Labs:  Recent Labs  11/20/16 1300 11/21/16 0517  HGB 11.9* 10.9*  HCT 35.2 32.2*  PLT 246 222  CREATININE 1.04* 1.02*    Estimated Creatinine Clearance: 36.6 mL/min (A) (by C-G formula based on SCr of 1.02 mg/dL (H)).   Medical History: Past Medical History:  Diagnosis Date  . Breast cancer ([place])   . Cervicodynia   . Chronic back pain   . Chronic cystitis   . Dementia   . Dysphagia   . Esophageal reflux   . HBP (high blood pressure)   . History of augmentation mammoplasty   . History of renal stone   . Hypothyroidism   . IBS (irritable bowel syndrome)   . Incontinence   . Interstitial cystitis   . Microscopic hematuria   . Polio     Assessment: 83 y/o F with a h/o atrial fibrillation on Xarelto 20 mg daily PTA admitted with several days of diarrhea.   CrCl (actual body weight)= 42 ml/min  Plan:  Will adjust Xarelto dosing to 15 mg daily with CrCl < 50 ml/min.   Ulice Dash D 11/21/2016,9:43 AM

## 2016-11-21 NOTE — Progress Notes (Signed)
Initial Nutrition Assessment  DOCUMENTATION CODES:   Not applicable  INTERVENTION:  1. Ensure Enlive po BID, each supplement provides 350 kcal and 20 grams of protein  2. Vital Cuisine Shake BID with meals, each supplement provides 500 calories and 22 grams of protein  NUTRITION DIAGNOSIS:   Inadequate oral intake related to poor appetite, chronic illness as evidenced by per patient/family report, percent weight loss.  GOAL:   Patient will meet greater than or equal to 90% of their needs  MONITOR:   PO intake, I & O's, Labs, Supplement acceptance, Weight trends  REASON FOR ASSESSMENT:   Malnutrition Screening Tool    ASSESSMENT:   Loretta Johns is an 83 yo female with PMH of diverticulitis, breast cancer, chronic back pain, polio, dementia, dysphagia, presents with nausea and diarrhea for the last 3-4 days.   Spoke with Loretta Johns and daughter this morning. Daughter provides most of the history. She reports patient hasn't been eating much for the past 6 weeks. Some milkshakes,  "whatever I can get in her," normally only consuming 2-3 bites of things. Reports a 159 pound - 137 pound weight loss, a 22 pound/14% severe weight loss for timeframe.14 pound/9% severe weight loss over 1 month per chart. Had some vanilla yogurt with a muffin mixed in, a small amount of potatoes, and 2 cups of coffee this morning. Per daughter, patient's dentures don't fit due to her weight loss.  She does like soup and crackers, encouraged daughter to order for patient. Daughter asked RD to provide anything we can to help her meet needs. Per RN, patient drank some ensure yesterday. Daughter also states patient enjoys milk shakes, will order vital cuisine. Monitor for needs.  Started physical exam but unable to complete at this time due to MD visit. Orbitals WNL, Arms WNL, Clavicles moderate. Will complete the rest of physical exam at follow-up.  Labs reviewed  Medications reviewed and include:  NS 20K+  at 29mL/hr  Diet Order:  Diet Heart Room service appropriate? Yes; Fluid consistency: Thin  Skin:  Reviewed, no issues  Last BM:  11/20/2016  Height:   Ht Readings from Last 1 Encounters:  11/20/16 5\' 2"  (1.575 m)    Weight:   Wt Readings from Last 1 Encounters:  11/20/16 140 lb (63.5 kg)    Ideal Body Weight:  50 kg  BMI:  Body mass index is 25.61 kg/m.  Estimated Nutritional Needs:   Kcal:  1250-1400 calories (MSJ x1.2-1.4)  Protein:  83-96 grams (1.3-1.5g/kg)  Fluid:  >1.5L  EDUCATION NEEDS:   Education needs no appropriate at this time  Satira Anis. Nija Koopman, MS, RD LDN Inpatient Clinical Dietitian Pager [phone]

## 2016-11-22 LAB — POTASSIUM: POTASSIUM: 4.3 mmol/L (ref 3.5–5.1)

## 2016-11-22 MED ORDER — GLYCOPYRROLATE 0.2 MG/ML IJ SOLN
0.2000 mg | INTRAMUSCULAR | Status: DC | PRN
  Filled 2016-11-22: qty 1

## 2016-11-22 MED ORDER — LORAZEPAM 2 MG/ML PO CONC: 1.0000 mg | ORAL | Status: DC | PRN

## 2016-11-22 MED ORDER — HALOPERIDOL LACTATE 5 MG/ML IJ SOLN: 0.5000 mg | INTRAMUSCULAR | Status: DC | PRN

## 2016-11-22 MED ORDER — HALOPERIDOL LACTATE 2 MG/ML PO CONC
0.5000 mg | ORAL | Status: DC | PRN
  Filled 2016-11-22: qty 0.3

## 2016-11-22 MED ORDER — LORAZEPAM 1 MG PO TABS: 1.0000 mg | ORAL_TABLET | ORAL | Status: DC | PRN

## 2016-11-22 MED ORDER — BIOTENE DRY MOUTH MT LIQD: 15.0000 mL | OROMUCOSAL | Status: DC | PRN

## 2016-11-22 MED ORDER — LEVOTHYROXINE SODIUM 50 MCG PO TABS
50.0000 ug | ORAL_TABLET | Freq: Every day | ORAL | Status: DC
  Administered 2016-11-23: 50 ug via ORAL
  Filled 2016-11-22: qty 1

## 2016-11-22 MED ORDER — ONDANSETRON 4 MG PO TBDP: 4.0000 mg | ORAL_TABLET | Freq: Four times a day (QID) | ORAL | Status: DC | PRN

## 2016-11-22 MED ORDER — ACETAMINOPHEN 325 MG PO TABS: 650.0000 mg | ORAL_TABLET | Freq: Four times a day (QID) | ORAL | Status: DC | PRN

## 2016-11-22 MED ORDER — LORAZEPAM 2 MG/ML IJ SOLN: 1.0000 mg | INTRAMUSCULAR | Status: DC | PRN

## 2016-11-22 MED ORDER — GLYCOPYRROLATE 1 MG PO TABS
1.0000 mg | ORAL_TABLET | ORAL | Status: DC | PRN
  Filled 2016-11-22: qty 1

## 2016-11-22 MED ORDER — ACETAMINOPHEN 650 MG RE SUPP: 650.0000 mg | Freq: Four times a day (QID) | RECTAL | Status: DC | PRN

## 2016-11-22 MED ORDER — HALOPERIDOL 0.5 MG PO TABS
0.5000 mg | ORAL_TABLET | ORAL | Status: DC | PRN
  Filled 2016-11-22: qty 1

## 2016-11-22 MED ORDER — POLYVINYL ALCOHOL 1.4 % OP SOLN
1.0000 [drp] | Freq: Four times a day (QID) | OPHTHALMIC | Status: DC | PRN
  Filled 2016-11-22: qty 15

## 2016-11-22 MED ORDER — DEXTROSE 5 % IV SOLN
1.0000 g | INTRAVENOUS | Status: DC
  Filled 2016-11-22 (×2): qty 10

## 2016-11-22 MED ORDER — ONDANSETRON HCL 4 MG/2ML IJ SOLN: 4.0000 mg | Freq: Four times a day (QID) | INTRAMUSCULAR | Status: DC | PRN

## 2016-11-22 NOTE — Progress Notes (Addendum)
New referral for [facility] services at home received from [place] following a  Palliative Medicine consult. Of note patient was currently being followed by Out Patient Palliative. Patient is an 83 year old woman with a known history of Diverticulitis, IBS, breast cancer, chronic back pain, A. Fib on Xarelto, polio and dementia. She was admitted to [facility] on 10/9 for evaluation of nausea and diarrhea. She was has had 3 hospitalizations in the past 6 months, the last one in 10/2016 for diverticulitis. She was discharged to SNF for rehab, but did not do well and was discharged home 5 days prior to admission to [facility]. Her daughter Santiago Glad met today with Palliative NP Asencion Gowda and she would like for her mother to return home with the support of hospice services. She currently has a hospital bed in the home and does not need any DME in place prior to discharge. Writer met with patient's daughter Santiago Glad to initiate education regarding hospice services, philosophy and team approach to care with good understanding voiced. Patient did not participate in the conversation. She appeared pale and weak, but did Recruitment consultant when spoken to. Santiago Glad would like for her mother to remain in the hospital overnight due to the inclement weather. Discussed with CMRN Joni Reining, attending physician Dr. Anselm Jungling in agreement. Plan is for patient to discharge home via EMS. Will need signed DNR in place for transport. Patient information faxed to referral. Thank you. Flo Shanks RN,BSn, [facility] liaison [phone] c  Flo Shanks RN, BSN, [facility] [place], hospital Liaison [phone] c

## 2016-11-22 NOTE — Clinical Social Work Note (Signed)
CSW received referral for SNF.  Case discussed with case manager and plan is to discharge home with hospice.  CSW to sign off please re-consult if social work needs arise.  Rilee Knoll R. Tor Tsuda, MSW, LCSWA [phone]  

## 2016-11-22 NOTE — Evaluation (Signed)
Occupational Therapy Evaluation Patient Details Name: Loretta Johns MRN: [id number] DOB: 02-26-1931 Today's Date: 11/22/2016    History of Present Illness Pt. is an 83 y.o. femalew who was admitted to [facility] with AFib, RVR, Hypotension, Diarrhea, vomiting, and abdominal pain.   Clinical Impression   Pt. is an 83 y.o. Female who was admitted to [facility] with AFib, RVR, Hypotension, Diarrhea, vomiting, and abdominal pain. Pt. Was recently hospitalized, went to [facility] for rehab, and was home for 4 days when she was admitted to [facility]. Pt.'s daughter reports that home care was set up, however nobody had started to come out to the house to assist yet. Pt.'s daughter resides with the patient. Pt.'s daughter assisted with ADLs, and IADLs, meal preparation, and medication management. Pt. presents with weakness in the LUE(which pt.'s daughter reports has worsened), decreased cognition, and impaired functional mobility. Pt. could benefit from skilled OT services to improve ROM, and increase functional use and engagement in ADLs, and IADLs. Pt. daughter plans to have pt. return home upon discharge.    Follow Up Recommendations  SNF    Equipment Recommendations       Recommendations for Other Services       Precautions / Restrictions                                                      ADL either performed or assessed with clinical judgement   ADL Overall ADL's : Needs assistance/impaired Eating/Feeding: Moderate assistance   Grooming: Moderate assistance   Upper Body Bathing: Maximal assistance   Lower Body Bathing: Total assistance   Upper Body Dressing : Maximal assistance   Lower Body Dressing: Total assistance                 General ADL Comments: Pt./family education was provided about positioning for ADLs, UE functioing, and ROM.     Vision Baseline Vision/History: Wears glasses Wears Glasses: Reading only Patient Visual Report: No  change from baseline       Perception     Praxis      Pertinent Vitals/Pain Pain Assessment: No/denies pain     Hand Dominance Right   Extremity/Trunk Assessment Upper Extremity Assessment Upper Extremity Assessment: Generalized weakness;LUE deficits/detail LUE Deficits / Details: shoulder 2-/5, elbow flexion, extension, wrist extension 3-/5 LUE Coordination: decreased fine motor;decreased gross motor           Communication Communication Communication: HOH   Cognition Arousal/Alertness: Awake/alert Behavior During Therapy: WFL for tasks assessed/performed Overall Cognitive Status: History of cognitive impairments - at baseline                                     General Comments       Exercises     Shoulder Instructions      Home Living Family/patient expects to be discharged to:: Private residence Living Arrangements: Children Available Help at Discharge: Family;Available 24 hours/day Type of Home: House Home Access: Ramped entrance     Home Layout: One level     Bathroom Shower/Tub: Tub/shower unit         Home Equipment: Hospital bed [facility] Lift)          Prior Functioning/Environment Level of Independence: Needs assistance  OT Problem List: Decreased strength;Decreased range of motion;Impaired UE functional use;Decreased knowledge of use of DME or AE;Decreased cognition      OT Treatment/Interventions: Self-care/ADL training;Therapeutic exercise;Patient/family education;Therapeutic activities;DME and/or AE instruction;Neuromuscular education    OT Goals(Current goals can be found in the care plan section) Acute Rehab OT Goals Patient Stated Goal: To return home. OT Goal Formulation: With patient Potential to Achieve Goals: Good  OT Frequency: Min 1X/week   Barriers to D/C:            Co-evaluation              AM-PAC PT "6 Clicks" Daily Activity     Outcome Measure Help from another person  eating meals?: A Lot Help from another person taking care of personal grooming?: A Lot Help from another person toileting, which includes using toliet, bedpan, or urinal?: Total Help from another person bathing (including washing, rinsing, drying)?: Total Help from another person to put on and taking off regular upper body clothing?: A Lot Help from another person to put on and taking off regular lower body clothing?: Total 6 Click Score: 9   End of Session    Activity Tolerance: Patient tolerated treatment well Patient left: in bed  OT Visit Diagnosis: Muscle weakness (generalized) (M62.81)                Time: 1030-1053 OT Time Calculation (min): 23 min Charges:  OT General Charges $OT Visit: 1 Visit OT Evaluation $OT Eval Moderate Complexity: 1 Mod G-Codes: OT G-codes **NOT FOR INPATIENT CLASS** Functional Limitation: Self care Self Care Current Status (L2440): At least 60 percent but less than 80 percent impaired, limited or restricted Self Care Goal Status (N0272): At least 20 percent but less than 40 percent impaired, limited or restricted   Harrel Carina, MS, OTR/L Harrel Carina, MS, OTR/L 11/22/2016, 11:34 AM

## 2016-11-22 NOTE — Care Management (Signed)
Left message with [facility] of need for home visit within 24 hours of discharge.  Services that will be requested: SN PT Aide OT SW.  Daughter Santiago Glad  requests that that patient travel home via ems.  Discussed that could not guarantee ins will cover but patient is not able to sit or stand at present time.  karen says "they won't let her go back to skilled nursing."  CM clarified that patient can go back to skilled nursing but patient is in her copay days.  After meeting with palliative care, Santiago Glad has decided on returning home under hospice care with [facility].  Has hospital bed with another agency. Notified [facility] of change in plans

## 2016-11-22 NOTE — Consult Note (Signed)
Consultation Note Date: 11/22/2016   Patient Name: Loretta Johns  DOB: 20-May-1931  MRN: [id number]  Age / Sex: 83 y.o., female  PCP: [facility] Referring Physician: Vaughan Basta, *  Reason for Consultation: Establishing goals of care  HPI/Patient Profile: Loretta Johns is a 83 y.o. female with a known history of Diverticulitis, IBS, breast cancer, chronic back pain, A. Fib on Xarelto, polio and dementia. Per EMR, the patient was treated with diverticulitis with Cipro and Flagyl for about 20 days. She was discharged to rehabilitation facility from the hospital and discharged to home 5 days ago. She has had watery diarrhea for the past 3-4 days. She also had some abdominal pain but no bloody stool or melena. The patient was found to have hypotension and A. fib with RVR at 120s in the ED. She was treated with normal saline bolus in the ED.   Clinical Assessment and Goals of Care:  Spoke with patient, daughter, and granddaughter at bedside. They state Loretta Johns has dementia and sometimes has better days of being able to remember things than others. Loretta Johns is confused, and tells me her daughter is her mother when I pointed and asked who she is. She is not eating well and per her family, and has to be forced to eat and drink and will drink fluid but will only take bites of food. She has had a weight loss of around 25 pounds. Her daughter states she did not do well with SNF, and is just not doing well overall, and has declined. She states Loretta Johns has told her "I'm tired, and I'm ready, and I just want to go home". The family has decided to take her home with hospice and make her comfort care with a DNR status. When Loretta Johns was asked by her daughter if she wanted to go home and have Hospice, she agreed.     Patient's mental status waxes and wanes with dementia.  Daughter is Media planner. Daughter has no siblings.    [place] with Hospice. Patient and family want to continue Synthroid, Xeralto and current antibiotic course as needed.  Code Status/Advance Care Planning:  DNR    Symptom Management:   Norco for pain, no current complaints of pain.  Haldol for agitation  Ativan for anxiety  Robinol for secretions  Palliative Prophylaxis:   Aspiration  Additional Recommendations (Limitations, Scope, Preferences): Patient and family want to continue Synthroid, Xeralto and current antibiotic course as needed. Continue other medications on patient/ family preference.    Prognosis:   < 6 months Weight loss, failure to thrive, poor PO intake  Discharge Planning: Home with Hospice      Primary Diagnoses: Present on Admission: . Diarrhea   I have reviewed the medical record, interviewed the patient and family, and examined the patient. The following aspects are pertinent.  Past Medical History:  Diagnosis Date  . Breast cancer ([place])   . Cervicodynia   . Chronic back pain   .  Chronic cystitis   . Dementia   . Dysphagia   . Esophageal reflux   . HBP (high blood pressure)   . History of augmentation mammoplasty   . History of renal stone   . Hypothyroidism   . IBS (irritable bowel syndrome)   . Incontinence   . Interstitial cystitis   . Microscopic hematuria   . Polio    Social History   Social History  . Marital status: Widowed    Spouse name: N/A  . Number of children: N/A  . Years of education: N/A   Social History Main Topics  . Smoking status: Never Smoker  . Smokeless tobacco: Never Used  . Alcohol use No  . Drug use: No  . Sexual activity: Not Currently   Other Topics Concern  . Not on file   Social History Narrative   Lives at home with daughter, has a walker and wheel chair   Family History  Problem Relation Age of Onset  . CAD Mother   . Cancer Father   . Kidney  disease Brother        Reanl cancer  . Prostate cancer Neg Hx    Scheduled Meds: . allopurinol  100 mg Oral Daily  . colchicine  0.6 mg Oral Daily  . feeding supplement (ENSURE ENLIVE)  237 mL Oral BID BM  . Influenza vac split quadrivalent PF  0.5 mL Intramuscular Tomorrow-1000  . levothyroxine  50 mcg Oral QAC breakfast  . metoprolol succinate  12.5 mg Oral Daily  . risperiDONE  0.5 mg Oral BID  . rivaroxaban  15 mg Oral Q breakfast   Continuous Infusions: . 0.9 % NaCl with KCl 20 mEq / L 75 mL/hr at 11/22/16 1126  . cefTRIAXone (ROCEPHIN)  IV Stopped (11/22/16 1153)   PRN Meds:.acetaminophen **OR** acetaminophen, albuterol, HYDROcodone-acetaminophen, ondansetron **OR** ondansetron (ZOFRAN) IV Medications Prior to Admission:  Prior to Admission medications   Medication Sig Start Date End Date Taking? Authorizing Provider  allopurinol (ZYLOPRIM) 100 MG tablet Take 100 mg by mouth daily. 11/15/16  Yes [provider]  colchicine 0.6 MG tablet Take 0.6 mg by mouth daily. 11/15/16  Yes [provider]  hydrochlorothiazide (MICROZIDE) 12.5 MG capsule Take 12.5 mg by mouth daily.   Yes [provider]  Levothyroxine Sodium 75 MCG CAPS Take 50 mcg by mouth daily before breakfast.    Yes [provider]  metoprolol succinate (TOPROL-XL) 25 MG 24 hr tablet Take 12.5 mg by mouth daily. 10/07/16  Yes [provider]  risperiDONE (RISPERDAL) 0.25 MG tablet Take 1 tablet (0.25 mg total) by mouth 2 (two) times daily as needed (confusion or hallucinations). Patient taking differently: Take 0.5 mg by mouth 2 (two) times daily.  06/05/16  Yes Clapacs, Madie Reno, MD  XARELTO 20 MG TABS tablet Take 20 mg by mouth daily with breakfast. 03/06/16  Yes [provider]  dicyclomine (BENTYL) 10 MG capsule Take 1 capsule (10 mg total) by mouth 4 (four) times daily -  before meals and at bedtime. Patient not taking: Reported on 11/20/2016 01/28/16   Hillary Bow,  MD  HYDROcodone-acetaminophen (NORCO/VICODIN) 5-325 MG tablet Take 1 tablet by mouth every 6 (six) hours as needed. 09/03/16   [provider]  ondansetron (ZOFRAN) 4 MG tablet Take 1 tablet (4 mg total) by mouth every 6 (six) hours as needed for nausea. Patient not taking: Reported on 06/06/2016 01/28/16   Hillary Bow, MD   Allergies  Allergen Reactions  .  [place] Grass Extract Itching  . Chocolate Flavor Other (See Comments)    Reaction: unknown  . Midazolam Other (See Comments)    Reaction: trouble waking up  . Pravastatin Other (See Comments)    WAS UNABLE TO WALK PER PT.  Marland Kitchen Propoxyphene Napsylate [Propoxyphene] Other (See Comments)    Darvocet-N 100 Reaction: unknown  . Reglan [Metoclopramide] Other (See Comments)    Reaction: felt like tongue was "drawing up"   Review of Systems  Unable to perform ROS: Dementia    Physical Exam  Constitutional: No distress.  HENT:  Head: Normocephalic.  Eyes: EOM are normal.  Cardiovascular:  Warm and dry  Pulmonary/Chest: Effort normal.  Neurological: She is alert.  Can answer some questions, but is otherwise confused.     Vital Signs: BP (!) 117/58 (BP Location: Right Arm)   Pulse 72   Temp 97.7 F (36.5 C) (Oral)   Resp 18   Ht 5\' 2"  (1.575 m)   Wt 63.5 kg (140 lb)   SpO2 99%   BMI 25.61 kg/m  Pain Assessment: No/denies pain   Pain Score: Asleep   SpO2: SpO2: 99 % O2 Device:SpO2: 99 % O2 Flow Rate: .O2 Flow Rate (L/min): 3 L/min  IO: Intake/output summary:  Intake/Output Summary (Last 24 hours) at 11/22/16 1455 Last data filed at 11/22/16 1415  Gross per 24 hour  Intake             2190 ml  Output                0 ml  Net             2190 ml    LBM: Last BM Date: 11/22/16 Baseline Weight: Weight: 63.5 kg (140 lb) Most recent weight: Weight: 63.5 kg (140 lb)     Palliative Assessment/Data: 30%     Time In: 1:50 Time Out: 3:30 Time Total: 1 hour 40 minutes Greater than 50%  of this time was  spent counseling and coordinating care related to the above assessment and plan.  Signed by: Asencion Gowda, NP 11/22/2016 3:19 PM Office: [phone] 7am-7pm  Pager: [phone] Call primary team after hours   Please contact Palliative Medicine Team phone at [phone] for questions and concerns.  For individual provider: See Shea Evans

## 2016-11-22 NOTE — Clinical Social Work Note (Signed)
Clinical Social Work Assessment  Patient Details  Name: Loretta Johns MRN: [id number] Date of Birth: 02/06/1932  Date of referral:  11/22/16               Reason for consult:  Facility Placement                Permission sought to share information with:  Facility Sport and exercise psychologist, Family Supports Permission granted to share information::  Yes, Verbal Permission Granted  Name::     Delane Ginger Daughter   [phone]   Agency::  home health and SNF admissions  Relationship::     Contact Information:     Housing/Transportation Living arrangements for the past 2 months:  [place] of Information:  Adult Children Patient Interpreter Needed:  None Criminal Activity/Legal Involvement Pertinent to Current Situation/Hospitalization:  No - Comment as needed Significant Relationships:  Adult Children Lives with:  Adult Children Do you feel safe going back to the place where you live?  No Need for family participation in patient care:  Yes (Comment)  Care giving concerns:  Patient's family feels that patient needs more care, however they can not afford the copays or to pay privately for SNF   Social Worker assessment / plan:  Patient is an 83 year old female who is alert and oriented x1.  Assessment was completed by speaking with patient's daughter.  Patient lives with daughter and was just recently discharged from SNF.  Patient was at [facility] for 20 days so patient is now in copay days.  Patient's daughter reports the copays are $167 a day and patient can not afford them.  CSW suggested the daughter look into applying for Medicaid, CSW gave patient phone number for [place].  CSW also suggested patient's daughter look into seeing if there are any medicare supplement plans that patient can afford.   CSW was informed by [facility], which is the SNF patient was at recently, and patient with her family decided to take her back  home with home health.  CSW explained to patient's daughter the reason she is in copay days and the reason patient will not have 20 days 100 percent covered again until patient has a 60 day period of wellness.  CSW informed patient the only options she has are to either pay privately or go home with home health.  Patient's daughter decided to go home with home health CSW updated case manager.  Employment status:  Retired Nurse, adult PT Recommendations:  [place] / Referral to community resources:  [place], Adult Daycare  Patient/Family's Response to care:  Patient's family have decided to have patient return back home.  Patient/Family's Understanding of and Emotional Response to Diagnosis, Current Treatment, and Prognosis: Patient's family is aware of current treatment plan and prognosis.    Emotional Assessment Appearance:  Appears stated age Attitude/Demeanor/Rapport:    Affect (typically observed):  Appropriate, Calm, Pleasant, Stable Orientation:  Oriented to Self Alcohol / Substance use:  Not Applicable Psych involvement (Current and /or in the community):  No (Comment)  Discharge Needs  Concerns to be addressed:  Lack of Support, Cognitive Concerns, Financial / Insurance Concerns Readmission within the last 30 days:  Yes (10-26-16 to [facility]) Current discharge risk:  None, Cognitively Impaired, Physical Impairment, Lack of support system Barriers to Discharge:  No Barriers Identified   Ross Ludwig, LCSWA 11/22/2016, 11:56 AM

## 2016-11-22 NOTE — Progress Notes (Signed)
[place] [facility] [place] NAME: Loretta Johns    MR#:  [id number]  DATE OF BIRTH:  1931/05/02  SUBJECTIVE:  CHIEF COMPLAINT:   Chief Complaint  Patient presents with  . Diarrhea  . Nausea    recent admission for diverticulitis, sent to rehab, but did not improve much sent home from rehab 3 days ago. Brought here as have decreased appetite and weakness. Also lost 20 Lb weight in last 5-6 weeks as per daughter.  Daughter said- " She is ready for keeping her comfortable."  REVIEW OF SYSTEMS:   Pt Is demented and does not give any details or complains.  ROS  DRUG ALLERGIES:   Allergies  Allergen Reactions  . [place] Grass Extract Itching  . Chocolate Flavor Other (See Comments)    Reaction: unknown  . Midazolam Other (See Comments)    Reaction: trouble waking up  . Pravastatin Other (See Comments)    WAS UNABLE TO WALK PER PT.  Marland Kitchen Propoxyphene Napsylate [Propoxyphene] Other (See Comments)    Darvocet-N 100 Reaction: unknown  . Reglan [Metoclopramide] Other (See Comments)    Reaction: felt like tongue was "drawing up"    VITALS:  Blood pressure (!) 117/58, pulse 72, temperature 97.7 F (36.5 C), temperature source Oral, resp. rate 18, height 5\' 2"  (1.575 m), weight 63.5 kg (140 lb), SpO2 99 %.  PHYSICAL EXAMINATION:  GENERAL:  83 y.o.-year-old patient lying in the bed with no acute distress.  EYES: Pupils equal, round, reactive to light and accommodation. No scleral icterus. Extraocular muscles intact.  HEENT: Head atraumatic, normocephalic. Oropharynx and nasopharynx clear.  NECK:  Supple, no jugular venous distention. No thyroid enlargement, no tenderness.  LUNGS: Normal breath sounds bilaterally, no wheezing, rales,rhonchi or crepitation. No use of accessory muscles of respiration.  CARDIOVASCULAR: S1, S2 normal. No murmurs, rubs, or gallops.  ABDOMEN: Soft, nontender, nondistended. Bowel sounds present. No organomegaly or mass.   EXTREMITIES: No pedal edema, cyanosis, or clubbing.  NEUROLOGIC: Cranial nerves could not check as pt is not following commands. Muscle strength 2-3/5 in Upper extremities and 1/5 in lower extremities. Sensation and Gait not checked.  PSYCHIATRIC: The patient is alert and oriented x 0.  SKIN: No obvious rash, lesion, or ulcer.   Physical Exam LABORATORY PANEL:   CBC  Recent Labs Lab 11/21/16 0517  WBC 3.8  HGB 10.9*  HCT 32.2*  PLT 222   ------------------------------------------------------------------------------------------------------------------  Chemistries   Recent Labs Lab 11/20/16 1300 11/21/16 0517 11/22/16 0500  NA 139 139  --   K 3.4* 4.1 4.3  CL 109 108  --   CO2 21* 25  --   GLUCOSE 90 92  --   BUN 30* 26*  --   CREATININE 1.04* 1.02*  --   CALCIUM 7.5* 8.3*  --   MG 1.4* 2.2  --   AST 50*  --   --   ALT 50  --   --   ALKPHOS 103  --   --   BILITOT 0.7  --   --    ------------------------------------------------------------------------------------------------------------------  Cardiac Enzymes No results for input(s): TROPONINI in the last 168 hours. ------------------------------------------------------------------------------------------------------------------  RADIOLOGY:  No results found.  ASSESSMENT AND PLAN:   Active Problems:   Diarrhea  * Diarrhea, ruled out C. difficile colitis.  Negative GI panel and C. difficile test, IV fluid support.   Likely due to IBS or viral, Improved.  * UTI   IV rocephin, Sent  Ur cx- awaited result.  * A. fib with RVR. Possible due to dehydration and diarrhea. Continue Lopressor and Xarelto.  * Hypotension. Given normal saline bolus, blood pressure is normal now. Continue IV normal saline.  * Dehydration. Continue normal saline IV and follow-up BMP.  * Acute metabolic encephalopathy due to above.  Also have Baseline dementia.  * Hypokalemia. Give potassium supplement and follow-up  potassium and magnesium level.  * Dementia. Aspiration and fall precaution. PT evaluation.  Discussed with her daughter- She suggested as for last few months her mother is having worsening in condition. Have decreased physical activities and eating- and loosing weight. She will like to keep her comfortable and not to bring back in hospital.  All the records are reviewed and case discussed with Care Management/Social Workerr. Management plans discussed with the patient, family and they are in agreement.  CODE STATUS: DNR  TOTAL TIME TAKING CARE OF THIS PATIENT: 35 minutes.    POSSIBLE D/C IN 1-2 DAYS, DEPENDING ON CLINICAL CONDITION.   Vaughan Basta M.D on 11/22/2016   Between 7am to 6pm - Pager - [phone]  After 6pm go to [email or web address] - password [email or web address] [place] [facility]  Office  [phone]  CC: Primary care physician; [facility]  Note: This dictation was prepared with Dragon dictation along with smaller phrase technology. Any transcriptional errors that result from this process are unintentional.

## 2016-11-23 LAB — URINE CULTURE: Culture: >=100000 — AB

## 2016-11-23 LAB — CREATININE, SERUM
Creatinine, Ser: 0.78 mg/dL (ref 0.44–1.00)
GFR calc Af Amer: >60 mL/min (ref >60)
GFR calc non Af Amer: >60 mL/min (ref >60)

## 2016-11-23 MED ORDER — CEPHALEXIN 250 MG PO CAPS: 250.0000 mg | ORAL_CAPSULE | Freq: Two times a day (BID) | ORAL | 0 refills | Status: DC

## 2016-11-23 MED ORDER — AMOXICILLIN-POT CLAVULANATE 875-125 MG PO TABS: 1.0000 | ORAL_TABLET | Freq: Two times a day (BID) | ORAL | 0 refills | Status: AC

## 2016-11-23 MED ORDER — RIVAROXABAN 20 MG PO TABS: 20.0000 mg | ORAL_TABLET | Freq: Every day | ORAL | Status: DC

## 2016-11-23 MED ORDER — GLYCOPYRROLATE 1 MG PO TABS: 1.0000 mg | ORAL_TABLET | ORAL | 0 refills | Status: AC | PRN

## 2016-11-23 MED ORDER — LORAZEPAM 2 MG/ML PO CONC: 1.0000 mg | ORAL | 0 refills | Status: AC | PRN

## 2016-11-23 MED ORDER — RIVAROXABAN 20 MG PO TABS
20.0000 mg | ORAL_TABLET | Freq: Every day | ORAL | Status: DC
  Administered 2016-11-23: 20 mg via ORAL
  Filled 2016-11-23: qty 1

## 2016-11-23 MED ORDER — HYDROCODONE-ACETAMINOPHEN 5-325 MG PO TABS: 1.0000 | ORAL_TABLET | Freq: Four times a day (QID) | ORAL | 0 refills | Status: AC | PRN

## 2016-11-23 MED ORDER — RIVAROXABAN 15 MG PO TABS: 15.0000 mg | ORAL_TABLET | Freq: Every day | ORAL | 0 refills | Status: AC

## 2016-11-23 NOTE — Progress Notes (Signed)
Follow up visit made to new referral for [facility] services at home. Writer spoke with both patient and her daughter Santiago Glad in the room. Plan is for discharge home via EMS today. Discharge summary faxed to referral. Thank you. Flo Shanks RN, BSN, [facility] [place], hospital Liaison [phone] c

## 2016-11-23 NOTE — Progress Notes (Signed)
[place] for Xarelto Indication: atrial fibrillation  Allergies  Allergen Reactions  . [place] Grass Extract Itching  . Chocolate Flavor Other (See Comments)    Reaction: unknown  . Midazolam Other (See Comments)    Reaction: trouble waking up  . Pravastatin Other (See Comments)    WAS UNABLE TO WALK PER PT.  Marland Kitchen Propoxyphene Napsylate [Propoxyphene] Other (See Comments)    Darvocet-N 100 Reaction: unknown  . Reglan [Metoclopramide] Other (See Comments)    Reaction: felt like tongue was "drawing up"    Patient Measurements: Height: 5\' 2"  (157.5 cm) Weight: 140 lb (63.5 kg) IBW/kg (Calculated) : 50.1  Labs:  Recent Labs  11/20/16 1300 11/21/16 0517 11/23/16 0337  HGB 11.9* 10.9*  --   HCT 35.2 32.2*  --   PLT 246 222  --   CREATININE 1.04* 1.02* 0.78    Estimated Creatinine Clearance: 46.7 mL/min (by C-G formula based on SCr of 0.78 mg/dL).   Assessment: 83 y/o F with a h/o atrial fibrillation on Xarelto 20 mg daily PTA admitted with several days of diarrhea.   CrCl (actual body weight)= 52 ml/min  Plan:  Given improvement in SCr, will adjust rivaroxaban back to 20 mg PO daily with breakfast which is what she was taking prior to admission. Pharmacy to continue to monitor per protocol.  Lenis Noon, PharmD, BCPS Clinical Pharmacist 11/23/2016,8:54 AM

## 2016-11-23 NOTE — Discharge Summary (Addendum)
[facility] NAME: Loretta Johns    MR#:  [id number]  DATE OF BIRTH:  06/02/1931  DATE OF ADMISSION:  11/20/2016 ADMITTING PHYSICIAN: Demetrios Loll, MD  DATE OF DISCHARGE: 11/23/2016   PRIMARY CARE PHYSICIAN: [facility]    ADMISSION DIAGNOSIS:  Diarrhea, unspecified type [R19.7]  DISCHARGE DIAGNOSIS:  Active Problems:   Diarrhea   UTI-  SECONDARY DIAGNOSIS:   Past Medical History:  Diagnosis Date  . Breast cancer ([place])   . Cervicodynia   . Chronic back pain   . Chronic cystitis   . Dementia   . Dysphagia   . Esophageal reflux   . HBP (high blood pressure)   . History of augmentation mammoplasty   . History of renal stone   . Hypothyroidism   . IBS (irritable bowel syndrome)   . Incontinence   . Interstitial cystitis   . Microscopic hematuria   . Polio     HOSPITAL COURSE:   * Diarrhea, ruled out C. difficile colitis.  Negative GI panel and C. difficile test, IV fluid support.   Likely due to IBS or viral, Improved.  * UTI   IV rocephin, Sent Ur cx- have E fecalis\   Will give keflex on d/c.  * A. fib with RVR. Possible due to dehydration and diarrhea. Continue Lopressor and Xarelto.  * Hypotension. Given normal saline bolus, blood pressure is normal now. Continue IV normal saline.  * Dehydration. Continue normal saline IV and follow-up BMP.  * Acute metabolic encephalopathy due to above.  Also have Baseline dementia.  * Hypokalemia. Give potassium supplement and follow-up potassium and magnesium level.  * Dementia. Aspiration and fall precaution. PT evaluation.  Discussed with her daughter- She suggested as for last few months her mother is having worsening in condition. Have decreased physical activities and eating- and loosing weight. She will like to keep her comfortable and not to bring back in hospital. D/c home with hospice services arranged.  DISCHARGE  CONDITIONS:   Stable.  CONSULTS OBTAINED:    DRUG ALLERGIES:   Allergies  Allergen Reactions  . [place] Grass Extract Itching  . Chocolate Flavor Other (See Comments)    Reaction: unknown  . Midazolam Other (See Comments)    Reaction: trouble waking up  . Pravastatin Other (See Comments)    WAS UNABLE TO WALK PER PT.  Marland Kitchen Propoxyphene Napsylate [Propoxyphene] Other (See Comments)    Darvocet-N 100 Reaction: unknown  . Reglan [Metoclopramide] Other (See Comments)    Reaction: felt like tongue was "drawing up"    DISCHARGE MEDICATIONS:   Current Discharge Medication List    START taking these medications   Details  amoxicillin-clavulanate (AUGMENTIN) 875-125 MG tablet Take 1 tablet by mouth every 12 (twelve) hours. Qty: 6 tablet, Refills: 0    glycopyrrolate (ROBINUL) 1 MG tablet Take 1 tablet (1 mg total) by mouth every 4 (four) hours as needed (excessive secretions). Qty: 50 tablet, Refills: 0    LORazepam (ATIVAN) 2 MG/ML concentrated solution Place 0.5 mLs (1 mg total) under the tongue every 4 (four) hours as needed for anxiety. Qty: 30 mL, Refills: 0      CONTINUE these medications which have CHANGED   Details  HYDROcodone-acetaminophen (NORCO/VICODIN) 5-325 MG tablet Take 1 tablet by mouth every 6 (six) hours as needed for moderate pain or severe pain. Qty: 20 tablet, Refills: 0    Rivaroxaban (XARELTO) 15 MG TABS tablet Take 1  tablet (15 mg total) by mouth daily with breakfast. Qty: 30 tablet, Refills: 0      CONTINUE these medications which have NOT CHANGED   Details  allopurinol (ZYLOPRIM) 100 MG tablet Take 100 mg by mouth daily. Refills: 0    colchicine 0.6 MG tablet Take 0.6 mg by mouth daily. Refills: 0    Levothyroxine Sodium 75 MCG CAPS Take 50 mcg by mouth daily before breakfast.     metoprolol succinate (TOPROL-XL) 25 MG 24 hr tablet Take 12.5 mg by mouth daily.    risperiDONE (RISPERDAL) 0.25 MG tablet Take 1 tablet (0.25 mg total) by mouth  2 (two) times daily as needed (confusion or hallucinations). Qty: 60 tablet, Refills: 0      STOP taking these medications     hydrochlorothiazide (MICROZIDE) 12.5 MG capsule      dicyclomine (BENTYL) 10 MG capsule      ondansetron (ZOFRAN) 4 MG tablet          DISCHARGE INSTRUCTIONS:    Follow with PMD as needed. Hospice to follow at home.  If you experience worsening of your admission symptoms, develop shortness of breath, life threatening emergency, suicidal or homicidal thoughts you must seek medical attention immediately by calling 911 or calling your MD immediately  if symptoms less severe.  You Must read complete instructions/literature along with all the possible adverse reactions/side effects for all the Medicines you take and that have been prescribed to you. Take any new Medicines after you have completely understood and accept all the possible adverse reactions/side effects.   Please note  You were cared for by a hospitalist during your hospital stay. If you have any questions about your discharge medications or the care you received while you were in the hospital after you are discharged, you can call the unit and asked to speak with the hospitalist on call if the hospitalist that took care of you is not available. Once you are discharged, your primary care physician will handle any further medical issues. Please note that NO REFILLS for any discharge medications will be authorized once you are discharged, as it is imperative that you return to your primary care physician (or establish a relationship with a primary care physician if you do not have one) for your aftercare needs so that they can reassess your need for medications and monitor your lab values.    Today   CHIEF COMPLAINT:   Chief Complaint  Patient presents with  . Diarrhea  . Nausea    HISTORY OF PRESENT ILLNESS:  Loretta Johns  is a 83 y.o. female with a known history of Diverticulitis, IBS,  breast cancer, chronic back pain, A. Fib on Xarelto, polio and dementia. The patient was sent from home to the ED due to above chief complaints. The patient is demented, unable to provide any information. According to her daughter, the patient was treated with diverticulitis with Cipro and Flagyl for about 20 days. She was discharged to rehabilitation facility from the hospital and discharged to home 5 days ago. She has had watery diarrhea for the past 3-4 days. She also had some abdominal pain but no bloody stool or melena. The patient was found to have hypotension and A. fib with RVR at 120s in the ED. She was treated with normal saline bolus in the ED.   VITAL SIGNS:  Blood pressure (!) 140/58, pulse 71, temperature 97.8 F (36.6 C), resp. rate 18, height 5\' 2"  (1.575 m), weight 63.5 kg (140  lb), SpO2 97 %.  I/O:    Intake/Output Summary (Last 24 hours) at 11/23/16 1231 Last data filed at 11/23/16 1044  Gross per 24 hour  Intake              120 ml  Output                0 ml  Net              120 ml    PHYSICAL EXAMINATION:   GENERAL:  83 y.o.-year-old patient lying in the bed with no acute distress.  EYES: Pupils equal, round, reactive to light and accommodation. No scleral icterus. Extraocular muscles intact.  HEENT: Head atraumatic, normocephalic. Oropharynx and nasopharynx clear.  NECK:  Supple, no jugular venous distention. No thyroid enlargement, no tenderness.  LUNGS: Normal breath sounds bilaterally, no wheezing, rales,rhonchi or crepitation. No use of accessory muscles of respiration.  CARDIOVASCULAR: S1, S2 normal. No murmurs, rubs, or gallops.  ABDOMEN: Soft, nontender, nondistended. Bowel sounds present. No organomegaly or mass.  EXTREMITIES: No pedal edema, cyanosis, or clubbing.  NEUROLOGIC: Cranial nerves could not check as pt is not following commands. Muscle strength 2-3/5 in Upper extremities and 1/5 in lower extremities. Sensation and Gait not checked.   PSYCHIATRIC: The patient is alert and oriented x 0.  SKIN: No obvious rash, lesion, or ulcer.    DATA REVIEW:   CBC  Recent Labs Lab 11/21/16 0517  WBC 3.8  HGB 10.9*  HCT 32.2*  PLT 222    Chemistries   Recent Labs Lab 11/20/16 1300 11/21/16 0517 11/22/16 0500 11/23/16 0337  NA 139 139  --   --   K 3.4* 4.1 4.3  --   CL 109 108  --   --   CO2 21* 25  --   --   GLUCOSE 90 92  --   --   BUN 30* 26*  --   --   CREATININE 1.04* 1.02*  --  0.78  CALCIUM 7.5* 8.3*  --   --   MG 1.4* 2.2  --   --   AST 50*  --   --   --   ALT 50  --   --   --   ALKPHOS 103  --   --   --   BILITOT 0.7  --   --   --     Cardiac Enzymes No results for input(s): TROPONINI in the last 168 hours.  Microbiology Results  Results for orders placed or performed during the hospital encounter of 11/20/16  C difficile quick scan w PCR reflex     Status: None   Collection Time: 11/20/16  1:00 PM  Result Value Ref Range Status   C Diff antigen NEGATIVE NEGATIVE Final   C Diff toxin NEGATIVE NEGATIVE Final   C Diff interpretation No C. difficile detected.  Final  Gastrointestinal Panel by PCR , Stool     Status: None   Collection Time: 11/20/16  1:00 PM  Result Value Ref Range Status   Campylobacter species NOT DETECTED NOT DETECTED Final   Plesimonas shigelloides NOT DETECTED NOT DETECTED Final   Salmonella species NOT DETECTED NOT DETECTED Final   Yersinia enterocolitica NOT DETECTED NOT DETECTED Final   Vibrio species NOT DETECTED NOT DETECTED Final   Vibrio cholerae NOT DETECTED NOT DETECTED Final   Enteroaggregative E coli (EAEC) NOT DETECTED NOT DETECTED Final   Enteropathogenic E coli (EPEC) NOT DETECTED NOT DETECTED  Final   Enterotoxigenic E coli (ETEC) NOT DETECTED NOT DETECTED Final   Shiga like toxin producing E coli (STEC) NOT DETECTED NOT DETECTED Final   Shigella/Enteroinvasive E coli (EIEC) NOT DETECTED NOT DETECTED Final   Cryptosporidium NOT DETECTED NOT DETECTED Final    Cyclospora cayetanensis NOT DETECTED NOT DETECTED Final   Entamoeba histolytica NOT DETECTED NOT DETECTED Final   Giardia lamblia NOT DETECTED NOT DETECTED Final   Adenovirus F40/41 NOT DETECTED NOT DETECTED Final   Astrovirus NOT DETECTED NOT DETECTED Final   Norovirus GI/GII NOT DETECTED NOT DETECTED Final   Rotavirus A NOT DETECTED NOT DETECTED Final   Sapovirus (I, II, IV, and V) NOT DETECTED NOT DETECTED Final  Urine Culture     Status: Abnormal   Collection Time: 11/20/16  7:27 PM  Result Value Ref Range Status   Specimen Description URINE, RANDOM  Final   Special Requests NONE  Final   Culture >=100,000 COLONIES/mL ENTEROCOCCUS FAECALIS (A)  Final   Report Status 11/23/2016 FINAL  Final   Organism ID, Bacteria ENTEROCOCCUS FAECALIS (A)  Final      Susceptibility   Enterococcus faecalis - MIC*    AMPICILLIN <=2 SENSITIVE Sensitive     LEVOFLOXACIN >=8 RESISTANT Resistant     NITROFURANTOIN <=16 SENSITIVE Sensitive     VANCOMYCIN 1 SENSITIVE Sensitive     * >=100,000 COLONIES/mL ENTEROCOCCUS FAECALIS    RADIOLOGY:  No results found.  EKG:   Orders placed or performed during the hospital encounter of 11/20/16  . ED EKG  . ED EKG  . EKG 12-Lead  . EKG 12-Lead  . EKG      Management plans discussed with the patient, family and they are in agreement.  CODE STATUS:     Code Status Orders        Start     Ordered   11/22/16 1523  Do not attempt resuscitation (DNR)  Continuous    Question Answer Comment  In the event of cardiac or respiratory ARREST Do not call a "code blue"   In the event of cardiac or respiratory ARREST Do not perform Intubation, CPR, defibrillation or ACLS   In the event of cardiac or respiratory ARREST Use medication by any route, position, wound care, and other measures to relive pain and suffering. May use oxygen, suction and manual treatment of airway obstruction as needed for comfort.      11/22/16 1522    Code Status History    Date  Active Date Inactive Code Status Order ID Comments User Context   11/20/2016  5:15 PM 11/22/2016  3:22 PM DNR [id number]  Demetrios Loll, MD Inpatient   10/22/2016  3:59 PM 10/26/2016  5:41 PM Full Code [id number]  Gladstone Lighter, MD Inpatient   01/28/2016  1:36 AM 01/28/2016  8:21 PM Full Code [id number]  Lance Coon, MD Inpatient    Advance Directive Documentation     Most Recent Value  Type of Advance Directive  Living will  Pre-existing out of facility DNR order (yellow form or pink MOST form)  -  "MOST" Form in Place?  -      TOTAL TIME TAKING CARE OF THIS PATIENT: 35 minutes.    Vaughan Basta M.D on 11/23/2016 at 12:31 PM  Between 7am to 6pm - Pager - [phone]  After 6pm go to [email or web address] - Proofreader  [facility]  Office  [phone]  CC: Primary care physician; [facility]   Note: This  dictation was prepared with Dragon dictation along with smaller phrase technology. Any transcriptional errors that result from this process are unintentional.

## 2016-11-23 NOTE — Progress Notes (Signed)
Discharge instructions explained to pts daughter/ verbalized an understanding/ iv and tele removed/ EMS to transport home

## 2016-12-13 DEATH — deceased

## 2018-10-27 IMAGING — CT CT ABD-PELV W/ CM
2 of 5 series · 15 of 46 positions shown, 17 images · IV contrast (APPLIED)
Comparison: 06/06/2016And previous

CLINICAL DATA: Pt to ed with c/o right side abd pain, denies n/v/d.
Reports constipation x several days., HX hysterectomy and
cholecystectomy. ^100mL O60LET-B11 IOPAMIDOL (O60LET-B11)
INJECTION 61%

EXAM:
CT ABDOMEN AND PELVIS WITH CONTRAST
TECHNIQUE: Multidetector CT imaging of the abdomen and pelvis was performed
using the standard protocol following bolus administration of
intravenous contrast.
CONTRAST:  100mL O60LET-B11 IOPAMIDOL (O60LET-B11) INJECTION 61%

[Series 2: axial st · axial · 0.76mm/px · z∈[-1031,-576]mm · 12 of 103 slices shown, 14 images]
[im 6/103  soft-tissue]
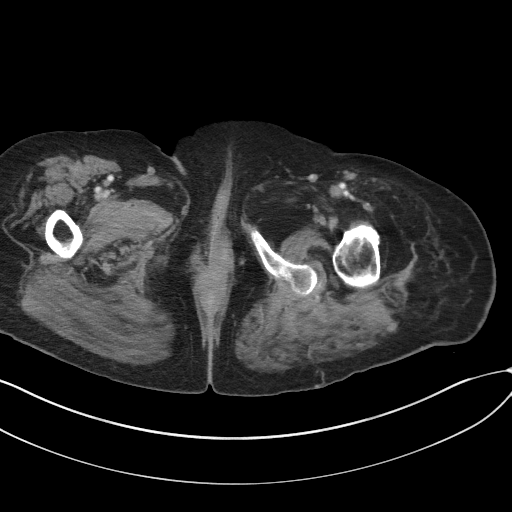
[im 6/103  bone]
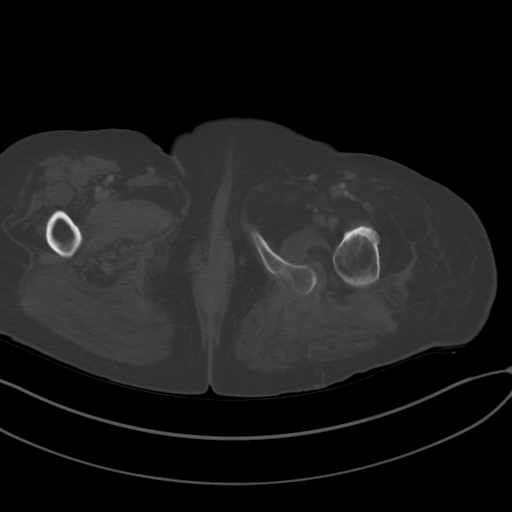
[im 17/103  soft-tissue]
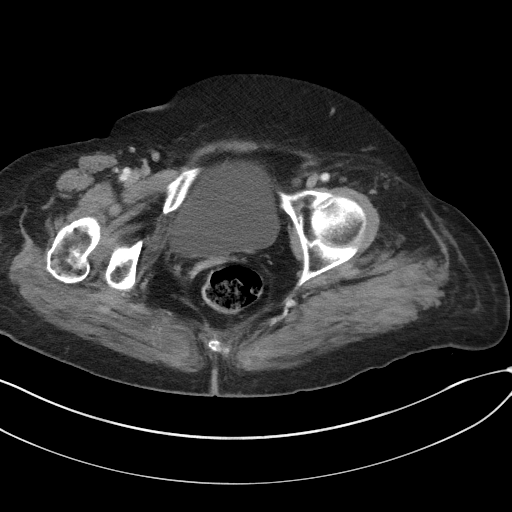
[im 22/103  soft-tissue]
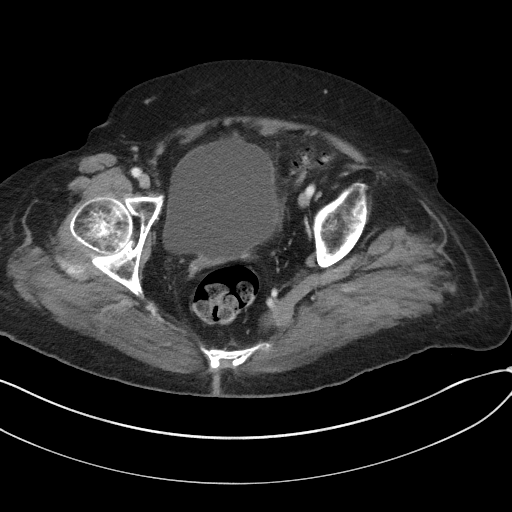
[im 33/103  soft-tissue]
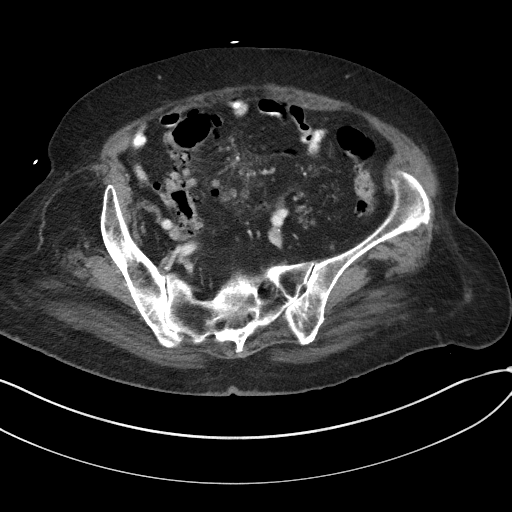
[im 38/103  soft-tissue]
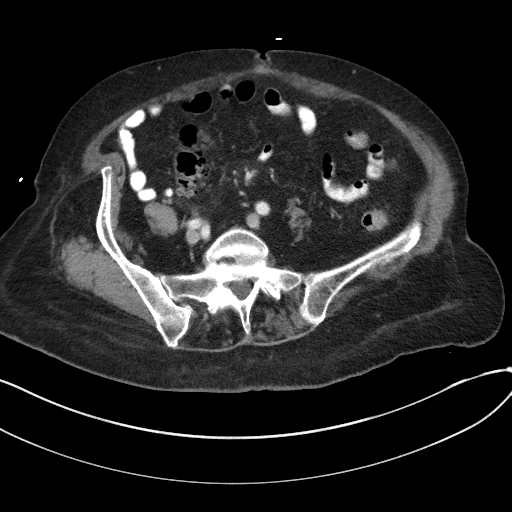
[im 49/103  soft-tissue]
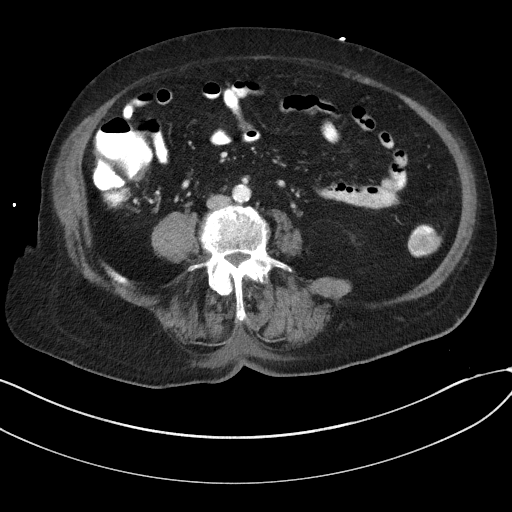
[im 54/103  soft-tissue]
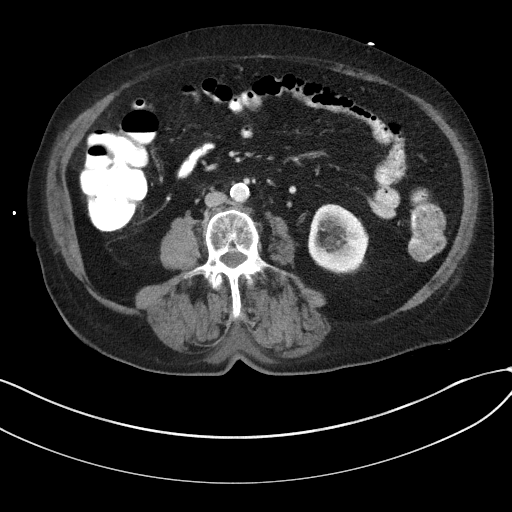
[im 65/103  soft-tissue]
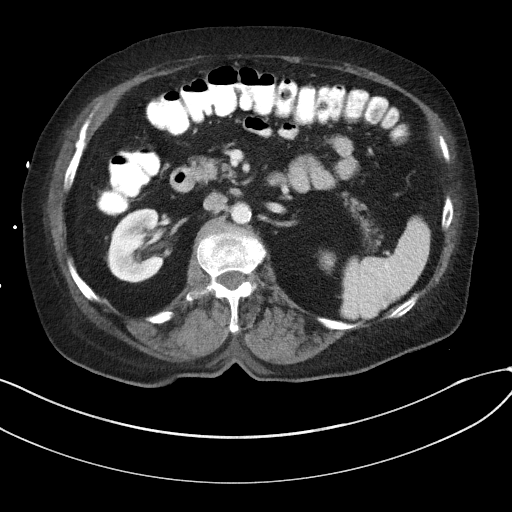
[im 70/103  soft-tissue]
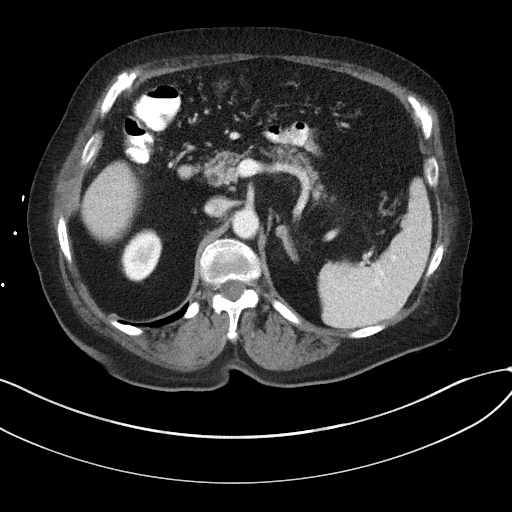
[im 70/103  bone]
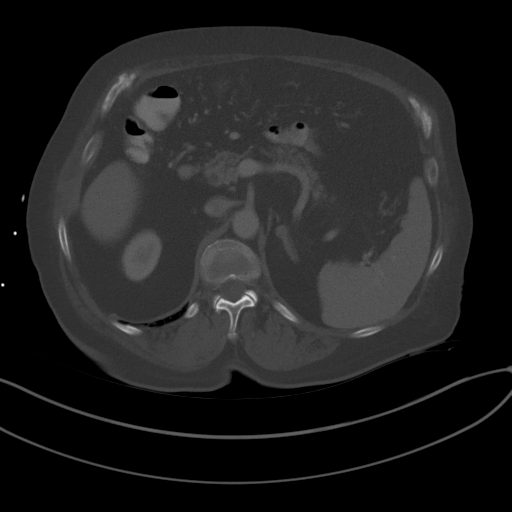
[im 81/103  soft-tissue]
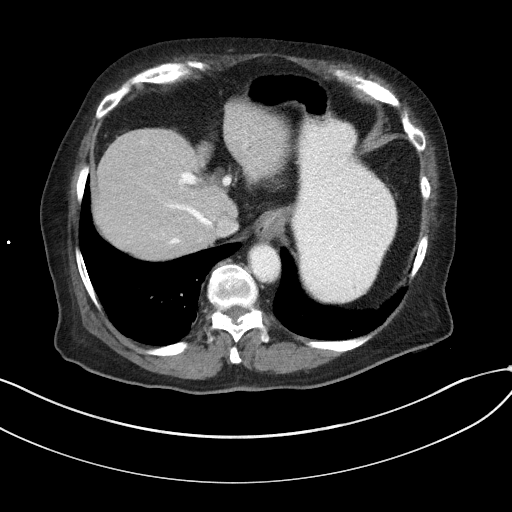
[im 86/103  soft-tissue]
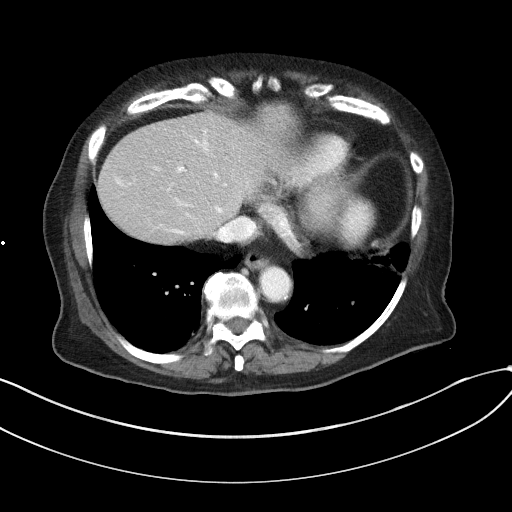
[im 97/103  soft-tissue]
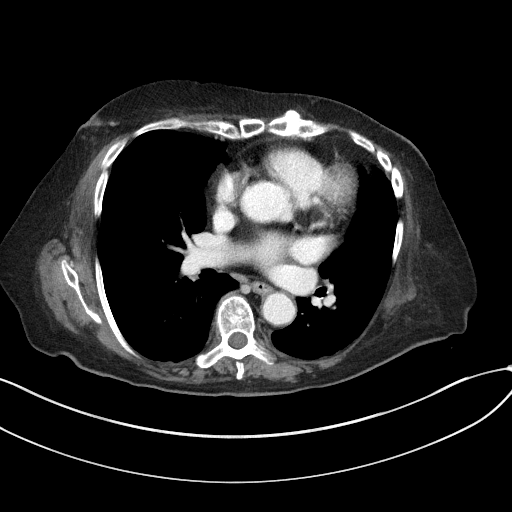

[Series 5: coronal st · coronal · 0.74mm/px · 3 of 101 slices shown]
[im 34/101  soft-tissue]
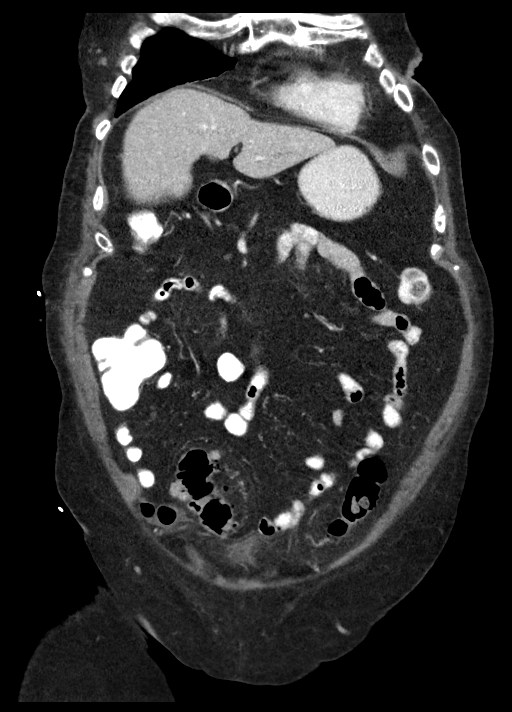
[im 45/101  soft-tissue]
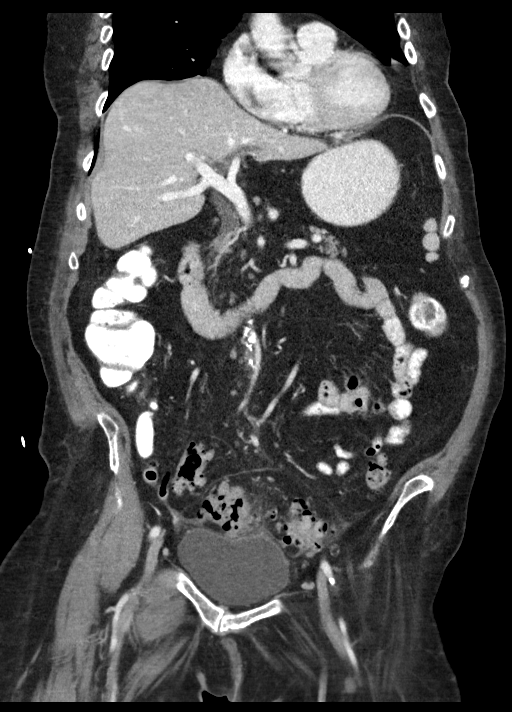
[im 56/101  soft-tissue]
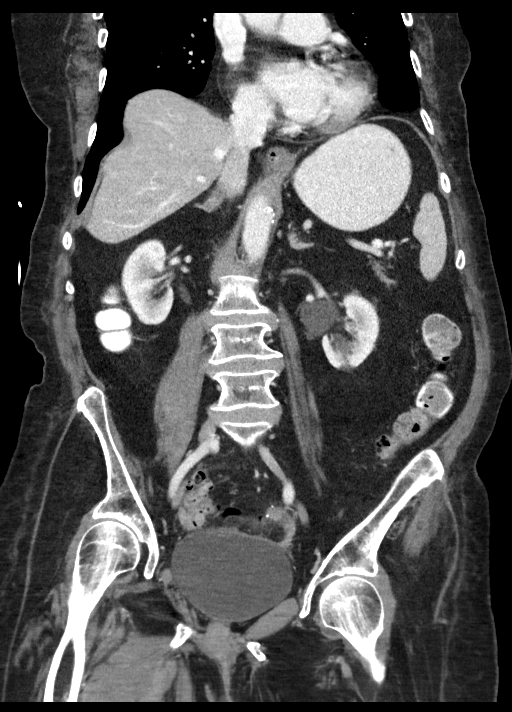

[15 of 46 positions shown; findings below may reference images not displayed]

FINDINGS: Lower chest: Stable coarse parenchymal opacities in the inferior
lingula. No pleural or pericardial effusion.

Hepatobiliary: No focal liver abnormality is seen. Status post
cholecystectomy. No biliary dilatation.

Pancreas: Mild atrophy without mass or ductal dilatation.

Spleen: Normal in size without focal abnormality.

Adrenals/Urinary Tract: Normal right adrenal. Stable left adrenal
nodule since 11/22/2011. Prominent left parapelvic cysts. No
hydronephrosis. No urolithiasis. Urinary bladder physiologically
distended.

Stomach/Bowel: stomach is distended by ingested material. Small
bowel is nondilated. Normal appendix. The colon is nondilated.
Multiple distal descending and sigmoid diverticula.
Inflammatory/edematous changes around the mid sigmoid colon, new
since prior study. No abscess.

Vascular/Lymphatic: Patchy aortoiliac arterial calcifications
without aneurysm. Portal vein patent. No abdominal or pelvic
adenopathy.

Reproductive: Status post hysterectomy. No adnexal masses.

Other: No ascites.  No free air.

Musculoskeletal: Mild degenerative changes L5-S1 interspace. Facet
DJD in the lower lumbar spine. Negative for fracture or worrisome
bone lesion.
IMPRESSION: 1. Mid sigmoid diverticulitis without abscess. Consider colonoscopic
follow-up to exclude mucosal lesion.
2.  Aortic Atherosclerosis ([id number])
